# Patient Record
Sex: Female | Born: 1975 | Race: White | Hispanic: No | Marital: Married | Smoking: Former smoker
Health system: Southern US, Community
[De-identification: ages and names within clinical notes are randomized; demographics above are authoritative.]

## PROBLEM LIST (undated history)

## (undated) DIAGNOSIS — R519 Headache, unspecified: Secondary | ICD-10-CM

## (undated) DIAGNOSIS — F410 Panic disorder [episodic paroxysmal anxiety] without agoraphobia: Secondary | ICD-10-CM

## (undated) DIAGNOSIS — E669 Obesity, unspecified: Secondary | ICD-10-CM

## (undated) DIAGNOSIS — J039 Acute tonsillitis, unspecified: Secondary | ICD-10-CM

## (undated) DIAGNOSIS — M545 Low back pain, unspecified: Secondary | ICD-10-CM

## (undated) DIAGNOSIS — R1084 Generalized abdominal pain: Secondary | ICD-10-CM

## (undated) DIAGNOSIS — F411 Generalized anxiety disorder: Secondary | ICD-10-CM

## (undated) DIAGNOSIS — M62838 Other muscle spasm: Secondary | ICD-10-CM

## (undated) DIAGNOSIS — F419 Anxiety disorder, unspecified: Secondary | ICD-10-CM

## (undated) DIAGNOSIS — Z789 Other specified health status: Secondary | ICD-10-CM

## (undated) DIAGNOSIS — K2100 Gastro-esophageal reflux disease with esophagitis, without bleeding: Secondary | ICD-10-CM

## (undated) HISTORY — DX: Obesity, unspecified: E66.9

## (undated) HISTORY — DX: Gastro-esophageal reflux disease with esophagitis, without bleeding: K21.00

## (undated) HISTORY — PX: TONSILLECTOMY: SUR1361

## (undated) HISTORY — DX: Generalized anxiety disorder: F41.1

## (undated) HISTORY — DX: Panic disorder (episodic paroxysmal anxiety): F41.0

## (undated) HISTORY — DX: Acute tonsillitis, unspecified: J03.90

## (undated) HISTORY — PX: CHOLECYSTECTOMY: SHX55

## (undated) HISTORY — DX: Low back pain, unspecified: M54.50

## (undated) HISTORY — DX: Generalized abdominal pain: R10.84

## (undated) HISTORY — DX: Anxiety disorder, unspecified: F41.9

## (undated) HISTORY — PX: OTHER SURGICAL HISTORY: SHX169

## (undated) HISTORY — DX: Other muscle spasm: M62.838

## (undated) HISTORY — DX: Headache, unspecified: R51.9

## (undated) HISTORY — PX: TUBAL LIGATION: SHX77

## (undated) HISTORY — PX: ABDOMINAL HYSTERECTOMY: SHX81

---
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
## 2000-12-11 ENCOUNTER — Emergency Department (HOSPITAL_COMMUNITY): Admission: EM | Admit: 2000-12-11 | Discharge: 2000-12-11 | Payer: Self-pay | Admitting: Internal Medicine

## 2000-12-11 ENCOUNTER — Encounter: Payer: Self-pay | Admitting: Emergency Medicine

## 2001-09-27 ENCOUNTER — Ambulatory Visit (HOSPITAL_COMMUNITY): Admission: RE | Admit: 2001-09-27 | Discharge: 2001-09-27 | Payer: Self-pay | Admitting: Obstetrics & Gynecology

## 2001-09-27 ENCOUNTER — Encounter: Payer: Self-pay | Admitting: Obstetrics & Gynecology

## 2003-04-19 ENCOUNTER — Inpatient Hospital Stay (HOSPITAL_COMMUNITY): Admission: AD | Admit: 2003-04-19 | Discharge: 2003-04-21 | Payer: Self-pay | Admitting: Obstetrics and Gynecology

## 2005-07-23 ENCOUNTER — Ambulatory Visit (HOSPITAL_COMMUNITY): Admission: RE | Admit: 2005-07-23 | Discharge: 2005-07-23 | Payer: Self-pay | Admitting: Pulmonary Disease

## 2006-04-13 ENCOUNTER — Inpatient Hospital Stay (HOSPITAL_COMMUNITY): Admission: AD | Admit: 2006-04-13 | Discharge: 2006-04-16 | Payer: Self-pay | Admitting: Obstetrics and Gynecology

## 2006-05-14 ENCOUNTER — Ambulatory Visit (HOSPITAL_COMMUNITY): Admission: RE | Admit: 2006-05-14 | Discharge: 2006-05-14 | Payer: Self-pay | Admitting: Obstetrics and Gynecology

## 2008-07-12 ENCOUNTER — Ambulatory Visit (HOSPITAL_COMMUNITY): Admission: RE | Admit: 2008-07-12 | Discharge: 2008-07-12 | Payer: Self-pay | Admitting: Pulmonary Disease

## 2010-11-01 NOTE — Op Note (Signed)
   NAME:  Bethany Wilson, Bethany Wilson                        ACCOUNT NO.:  [id number]   MEDICAL RECORD NO.:  [id number]                   PATIENT TYPE:  INP   LOCATION:  LDR2                                 FACILITY:  [facility]   PHYSICIAN:  Tilda Burrow, M.D.              DATE OF BIRTH:  [birth year]   DATE OF PROCEDURE:  DATE OF DISCHARGE:                                 OPERATIVE REPORT   PROCEDURE:  Assistance with postpartum hemorrhage.   SURGEON:  Tilda Burrow, M.D.   DETAILS OF PROCEDURE:  The patient was delivered as per Zerita Boers,  C.N.M. notes dictated elsewhere.  I was called to assist for continued  postpartum bleeding.  Thorough internal exam shows he cervix to be without  lacerations.  The patient has a continuous steady ooze.  The cervix was  grasped with 2 ring forceps cross clamping across the tips of the cervix in  order to try the tamponade at the introitus.  We had continued oozing from  the small opening between the ring forceps coming from internal. A smooth,  sharp, banjo curettage using a large curette was then performed obtaining no  placental remnants.  We then continued and used a temporary tamponade which  did not completely improved the condition.  We, therefore, then placed a  vaginal packing up to the internal os and then the vaginal vault with 2-inch  vaginal taping which is left in place for right now. Pressure did not result  in any bleeding past the packing.  This is similar to what was required with  the last pregnancy.  We will keep this in place and observe her for hourly  vital signs moving forward.  Epidural catheter removed by Zerita Boers,  C.N.M.      ___________________________________________                                            Tilda Burrow, M.D.   JVF/MEDQ  D:  04/19/2003  T:  04/19/2003  Job:  [id number]

## 2010-11-01 NOTE — H&P (Signed)
NAME:  Bethany Wilson, Bethany Wilson              ACCOUNT NO.:  [id number]   MEDICAL RECORD NO.:  [id number]          PATIENT TYPE:  AMB   LOCATION:  DAY                           FACILITY:  [facility]   PHYSICIAN:  Tilda Burrow, M.D. DATE OF BIRTH:  [birth year]   DATE OF ADMISSION:  DATE OF DISCHARGE:  LH                              HISTORY & PHYSICAL   ADMISSION DIAGNOSIS:  Desire for elective permanent sterilization.   HISTORY OF PRESENT ILLNESS:  This 35 year old female gravida 3, para 3,  now one month status post vaginal delivery of her third infant, is  admitted for an elective permanent sterilization.  Bethany Wilson remains  abstinent since she delivered four weeks ago.  She desires a permanent  procedure.  She understands the failure rate of this is less than 1%  with Kremes, a fallopian ring application.  Instructions are reviewed  with the patient.   PAST MEDICAL HISTORY:  Seasonal allergies.   PAST SURGICAL HISTORY:  1. Cholecystectomy.  2. Tonsillectomy.  3. Diagnostic laparoscopy, Dr. Langley Gauss.   SPECIAL NOTE:  Dr. Lisette Grinder made note in his comments to the patient that  there was lots of scar tissue around the navel from prior surgery.  Palpation of the abdomen delineates a possible tiny hernia, so a peri-  umbilical incision will be altered to address this concern.   ALLERGIES:  CODEINE AND MOTRIN cause rashes.  The patient is able to  take Vicodin without problems.   SOCIAL HISTORY:  Married.  Works for [employer].   PRIOR OB HISTORY:  A vaginal delivery x3.   PHYSICAL EXAMINATION:  VITAL SIGNS:  Height 5 feet 6 inches, weight 284  pounds, blood pressure 120/78, pulse in t he 70's.  GENERAL:  A large-framed overweight Caucasian female.  HEENT:  Pupils equal, round, reactive.  Extraocular movements intact.  NECK:  Supple.  CHEST:  Clear to auscultation.  HEART:  A regular rate and rhythm.  No appreciable murmurs, rubs or  gallops.  ABDOMEN:  Well-healed umbilical  and suprapubic port sites.  The  umbilicus is very flat and the patient gives a history of prior  challenging entry at laparoscopy.  We will therefore go in as if there  is scar tissue and/or possible hernia.  GENITOURINARY:  External genitalia:  Multiparous, good vaginal length,  good pelvic support.  The uterus is anterior, nontender.  Adnexa without  masses.   IMPRESSION:  Elective sterilization, umbilical hernia.   PLAN:  Laparoscopic tubal sterilization, fallopian rings, umbilical  hernia repair.      Tilda Burrow, M.D.  Electronically Signed     JVF/MEDQ  D:  05/12/2006  T:  05/12/2006  Job:  [id number]   cc:   [facility]

## 2010-11-01 NOTE — Op Note (Signed)
NAME:  Bethany Wilson, Bethany Wilson                        ACCOUNT NO.:  [id number]   MEDICAL RECORD NO.:  [id number]                   PATIENT TYPE:  INP   LOCATION:  LDR2                                 FACILITY:  [facility]   PHYSICIAN:  Tilda Burrow, M.D.              DATE OF BIRTH:  [birth year]   DATE OF PROCEDURE:  DATE OF DISCHARGE:                                 OPERATIVE REPORT   PROCEDURE:  Epidural catheter placement (x2).   SURGEON:  Tilda Burrow, M.D.   DETAILS OF PROCEDURE:  The patient was placed in the sitting position just  before noon after a fluid bolus and standard preparation for epidural.  Loss  of resistance technique was used at the L3-4 interspace with loss of  resistance achieved about 6 cm beneath the skin.  The catheter was then  inserted approximately 3 cm in and taped to her back.  Infusion of a 5 cc  test dose in 1.5% Xylocaine with epinephrine was performed and then followed  with 12 cc per hour.  Everything seemed normal except there was a little  more superficial that we identified a loss of resistance sensation.  We,  therefore, were somewhat questionable as to the positioning of the epidural  catheter.   The patient initially felt like there was some early sensation developing,  but this turned out to be a false perception.  She did have no real relief  from the epidural.  This was tested over an hour.   I returned to labor and delivery at approximately 1:15 p.m. at which time  loss of resistance technique was again used at L2-3 interspace identifying  the epidural space at 8 cm in depth beneath the skin, instilling 5 cc of  1.5% Xylocaine with epinephrine, inserting the epidural catheter 4 cm into  the epidural space and then taping it to the back in standard fashion with  infusion of 5 cc bolus of 0.125% Marcaine with 2 mcg/mL of Fentanyl.  This  was followed with 12  cc per hour and the patient had good analgesic relief  obtained.  The patient had  received some Nubain, 10 mg, and Phenergan 12.5  mg just before the epidural placement.  The 2 analgesic agents combined to  give her hypotension with her blood pressure of 81/45 with no change in  fetal status.  The patient was given a second fluid bolus, IV ephedrine  times 10 mg followed by 10 mg IM with patient allowed then to continue  observation and labor.      ___________________________________________                                            Tilda Burrow, M.D.   JVF/MEDQ  D:  04/19/2003  T:  04/19/2003  Job:  [id number]  

## 2010-11-01 NOTE — Op Note (Signed)
NAME:  Bethany Wilson, Bethany Wilson              ACCOUNT NO.:  [id number]   MEDICAL RECORD NO.:  [id number]          PATIENT TYPE:  AMB   LOCATION:  DAY                           FACILITY:  [facility]   PHYSICIAN:  Tilda Burrow, M.D. DATE OF BIRTH:  1975-09-04   DATE OF PROCEDURE:  DATE OF DISCHARGE:                               OPERATIVE REPORT   PREOPERATIVE DIAGNOSIS:  Elective sterilization, umbilical hernia.   POSTOPERATIVE DIAGNOSIS:  Elective sterilization.   PROCEDURE:  Laparoscopic tubal sterilization, Falope ring.   SURGEON:  Dr. Christin Bach.   ASSISTANT:  None.   ANESTHESIA:  General with endotracheal intubation, _Idacavage__, CRNA.   COMPLICATIONS:  None.   FINDINGS:  Normal tubes and ovaries.  No evidence of adhesions to  abdomen, umbilicus, or pelvis.   INDICATIONS:  A 35 year old female with a prior laparoscopic  cholecystectomy procedures.  Therefore, a modified open technique was  used to insure that we were not addressing an umbilical hernia, as the  patient's umbilicus was pretty flat.   DESCRIPTION OF PROCEDURE:  The abdomen was prepped and draped in the  usual fashion.  The bladder in-and-out catheterized, and Hulka tenaculum  was attached to the cervix for uterine manipulation.  A semi-circular  infraumbilical incision was made from 3 o'clock to 9 o'clock around the  umbilicus, and the subcutaneous fat bluntly dissected down to the  fascia.  Digital inspection of the fascia revealed there was no hernia  present.  We then were able to carefully place a Veress needle through  the fascia and peritoneal surface with good water droplet test.  The  pneumoperitoneum to 3 liters CO2 allowed inspection of the abdomen.  There was no evidence of trauma to the internal organs, and there were  absolutely no adhesions in the area of the umbilicus.  The pelvis was  similarly normal in appearance.  Suprapubic trocar was introduced under  direct visualization.  An 8 mm trocar  used.  Attention was directed to  the uterus, tubes, and ovaries, which were grossly normal.  The  fallopian tubes were elevated, grasped, and a mid segment knuckle of  tube incarcerated on each side with a Falope ring applier.  The  patient then had 200 cc of saline instilled into the abdomen.  The  abdomen deflated.  Laparoscopic trocars removed and subcuticular 4-0  Dexon closure and trocar sites.  Sponge and needle counts were correct.  Patient went to the recovery room in stable condition.      Tilda Burrow, M.D.  Electronically Signed     JVF/MEDQ  D:  05/14/2006  T:  05/14/2006  Job:  [id number]

## 2010-11-01 NOTE — H&P (Signed)
NAME:  Bethany Wilson, Bethany Wilson              ACCOUNT NO.:  [id number]   MEDICAL RECORD NO.:  [id number]          PATIENT TYPE:  INP   LOCATION:  LDR1                          FACILITY:  [facility]   PHYSICIAN:  Tilda Burrow, M.D. DATE OF BIRTH:  May 04, 1976   DATE OF ADMISSION:  04/13/2006  DATE OF DISCHARGE:  LH                                HISTORY & PHYSICAL   REASON FOR ADMISSION:  Pregnancy at 38 weeks and 3 days for elective  induction of labor due to maternal discomfort and insomnia at patient's  request.   PAST MEDICAL HISTORY:  Positive for seasonal allergies.   PAST SURGICAL HISTORY:  Positive for gallbladder tests, consults and  diagnostic lab.   ALLERGIES:  She is allergic to codeine.   FAMILY HISTORY:  Negative.   OBSTETRICAL HISTORY:  She had postpartum hemorrhage with her last pregnancy  which was severe in intensity and had to be attended by Dr. Emelda Fear.  She  also had postpartum hemorrhage with 1st baby in [place], [place].   PRENATAL COURSE:  Uneventful.   LABORATORY DATA:  Blood type is B positive.  UDS negative.  Rubella is  immune.  Hep B surface antigen is negative.  HIV is negative.  HSV is  negative.  Scholl test nonreactive on both draws.  PAP normal. GCS  and  chlamydia are negative.  Repeat is negative.  AFP normal.  GBS is negative,  13.3, 28 weeks hematocrit 40.8. One hour glucose 175.   PHYSICAL EXAMINATION TODAY:  VITAL SIGNS:  Weight 296, blood pressure  126/90.  There is 1+ leuks in the urine, there is positive fetal movement  per patient.  Fundal height 38 cm.  Fetal heart rate is 150, __________ and  regular.  Cervix is 2-3 cm, 70-80% effaced, minus 1 station.   PLAN:  We are going to admit for intravenous pitocin, induction and labor.      Zerita Boers, Lanier Clam      Tilda Burrow, M.D.  Electronically Signed    DL/MEDQ  D:  56/38/7564  T:  04/13/2006  Job:  [id number]   cc:   [facility]   Francoise Schaumann. Milford Cage DO, FAAP  Fax:  [phone]

## 2010-11-01 NOTE — H&P (Signed)
   NAME:  Bethany Wilson, Bethany Wilson NO.:  [id number]   MEDICAL RECORD NO.:  [id number]                  PATIENT TYPE:   LOCATION:                                       FACILITY:   PHYSICIAN:  Tilda Burrow, M.D.              DATE OF BIRTH:   DATE OF ADMISSION:  04/19/2003  DATE OF DISCHARGE:                                HISTORY & PHYSICAL   ADMISSION DIAGNOSIS:  Pregnancy [redacted] weeks gestation, elective induction.  History of rapid labor.   HISTORY OF PRESENT ILLNESS:  This 35 year old female, gravida 2, para 1, AB  0, LMP July 18, 2002 placing menstrual EDC of April 25, 2003 with  corresponding first and second trimester ultrasounds.  She was admitted  after a pregnancy course followed through our office and notable for  appropriate weight gain and fundal height growth.  The patient has gained  from 25 pounds from 271 to 295, has 38 cm fundal height and estimated fetal  weight of 8-to-8-1/2 pounds.  Cervix 2 cm.  Pregnancy was conceived on  Clomid, dates are quite certain. The patient requests elective induction.  She is fully aware that all the usual risks of labor can occur with induced  deliveries as well as spontaneous labor.  She was induced with the first  child at 40 weeks 2 days at 4 cm dilated prior to induction and delivered in  4 hours.   PAST MEDICAL HISTORY:  Conceived on Clomid for anovulation.   PAST SURGICAL HISTORY:  Cholecystectomy, laparoscopic; D&C; tonsillectomy.   ALLERGIES:  CODEINE and MOTRIN.   MEDICATIONS:  Prenatal vitamins.   SOCIAL HISTORY:  Married.   PRENATAL LABS:  Include blood type B positive, EDS negative, rubella  immunity present.  Hepatitis, HIV, GC, Chlamydia, RPR and group B Strep all  negative.  MSAFP negative at 1:3400.  Glucose tolerance test 74 mg%.   The patient plans to bottle feed, plans to have circumcision and may  consider epidural if labor goes slowly.  She wishes to try IV meds first.   PHYSICAL EXAMINATION:  VITAL SIGNS:  Height 5 feet 6 inches.  Weight 295.  Blood pressure 114/80.  GENERAL:  General exam shows a healthy Caucasian female, alert and oriented  x3.  ABDOMEN: Term size fetus.  Vertex presentation, estimated fetal weight 8-to-  8-1/2 pounds.  Cervix 2 cm, 50%, minus 2, anterior.   PLAN:  Admit November 3 in a.m. for amniotomy and Pitocin induction of  labor.    ___________________________________________                                         Tilda Burrow, M.D.   JVF/MEDQ  D:  04/17/2003  T:  04/17/2003  Job:  [id number]

## 2010-11-01 NOTE — Group Therapy Note (Signed)
NAME:  CORDELL, COKE NO.:  [id number]   MEDICAL RECORD NO.:  [id number]          PATIENT TYPE:  INP   LOCATION:  LDR1                          FACILITY:  [facility]   PHYSICIAN:  Tilda Burrow, M.D. DATE OF BIRTH:  August 19, 1975   DATE OF PROCEDURE:  DATE OF DISCHARGE:                                   PROGRESS NOTE   DELIVERY NOTE:  Bethany Wilson complained of a little bit of rectal pressure, so  we checked her and sure enough she was fully dilated at about +2 station.  After literally one push, she had a spontaneous vaginal delivery of a viable  female infant and 12:11.  The mouth and nose were suctioned on the perineum  and the body delivered without any difficulty.  Pitocin 20 unites, diluted  in 1000 mL of lactated Ringer's was infused rapidly IV.  The placenta  separated spontaneously and delivered via controlled cord traction and  maternal pushing effort at 12:15.  It was carefully inspected and appears to  be intact with a three-vessel cord.   Due to a history of two severe postpartum hemorrhages requiring D&C,  although the patient states the official reason was uterine atony, she  received 250 mcg of IM Hemabate and 0.2 mg at IM Methergine.  Despite this  and vigorous uterine massaged, the uterus did become apneic and she started  bleeding.  A total of three more amps of 250 mcg Hemabate were given IM and  bimanual compression was performed.  She still remains apneic.  About 12-15  minutes into this during which she was receiving her medications and uterine  massage was being performed where she would go between being firm and  apneic, bleeding and not bleeding.  She was then given 250 mg of Cytotec PR.  Dr. Emelda Fear had already been called to attend the recovery room.  Five  minutes after she received her Cytotec PR, her bleeding cut off like a  faucet and her uterus became very firm.  At this point, she was not  suffering any GI side effects of all the  medications.   Estimated blood loss is 1500-2000 mL.   We will go ahead and give her an extra bag of Pitocin and some lactated  Ringer's.  Her pulse is below 100 and output is good and bleeding at this  point is under control.  The vagina was inspected and a first-degree  laceration was noted.  It was repaired was 2-0 Vicryl.  The epidural  catheter was then removed and the blue tip visualized as being intact.      Bethany Wilson, C.N.M.      Tilda Burrow, M.D.  Electronically Signed    FC/MEDQ  D:  04/14/2006  T:  04/14/2006  Job:  [id number]   cc:   [facility]   Francoise Schaumann. Milford Cage DO, FAAP  Fax: [phone]

## 2010-11-01 NOTE — Op Note (Signed)
   NAME:  Bethany Wilson, Bethany Wilson                        ACCOUNT NO.:  [id number]   MEDICAL RECORD NO.:  [id number]                   PATIENT TYPE:  INP   LOCATION:  LDR2                                 FACILITY:  [facility]   PHYSICIAN:  Tilda Burrow, M.D.              DATE OF BIRTH:  1976/03/22   DATE OF PROCEDURE:  04/19/2003  DATE OF DISCHARGE:                                  PROCEDURE NOTE   DELIVERY SUMMARY:  Onset of labor is April 19, 2003, at 9 a.m.  Date of delivery April 19, 2003, at 1503 p.m.  Length of first stage of labor 5 hours 50 minutes.  Length of second stage of labor 13 minutes.  Length of third stage of labor 17 minutes.   DELIVERY NOTE:  [name] had a normal spontaneous vaginal delivery of a  viable female infant under epidural anesthesia.  Upon delivery of head a  nuchal cord was noted, which was loosened, and the infant easily slipped  through without any difficulty.  A second degree perineal laceration was  noted.  Infant was dried, suctioned, cord clamped and cut.  Infant at  delivery had strong cry, good movement of all extremities, pinked up well,  and was placed on the mother's abdomen in good condition.  Third stage of  labor was actively managed with 20 units Pitocin in 1000 mL of D5 LR at a  rapid rate.  At this point in time when the placenta upon delivery of  placenta, Ms. [name] made it aware to me that with her last delivery at [facility] she had a postpartum hemorrhage in which she had to have uterine  packing for at least 24 hours.  At delivery of placenta a half-ounce of  Hemabate was given for prophylaxis but the patient had postpartum hemorrhage  due to uterine atony.  After approximately [id number] mL of blood loss, Dr.  Emelda Fear was notified and came to assess the patient at that time.  Vital  signs remained stable during the course of the hemorrhage, but he assessed  the patient.  Placenta and membranes were delivered intact and were  inspected  again by Dr. Emelda Fear and found to be intact.  Uterine curettage  was done.  There were no retained fragments noted with curettage, and at  that time Dr. Emelda Fear inspected for cervical laceration, is in attendance  with the patient.  Estimated blood loss is going to be approximately 1200-  1300 mL.     ________________________________________  ___________________________________________  Zerita Boers, N.M.                       Tilda Burrow, M.D.   DL/MEDQ  D:  62/13/0865  T:  04/19/2003  Job:  [id number]

## 2010-11-01 NOTE — Op Note (Signed)
NAME:  TITIANA, SEVERA              ACCOUNT NO.:  [id number]   MEDICAL RECORD NO.:  [id number]          PATIENT TYPE:  INP   LOCATION:  LDR1                          FACILITY:  [facility]   PHYSICIAN:  Lazaro Arms, M.D.   DATE OF BIRTH:  [birth year]   DATE OF PROCEDURE:  DATE OF DISCHARGE:                                 OPERATIVE REPORT   PROCEDURE:  Epidural placement.   INDICATIONS FOR PROCEDURE:  Tonya is actively in labor.  She is 5 cm,  requesting epidural be placed.   PROCEDURE PERFORMED:  She was placed in sitting position.  Betadine prep is  used.  1% Lidocaine was injected in the L3-L4 interspace.  A 17 gauge Touhy  needle was used, loss of resistance technique employed.  Two skin sticks had  to be made but at the epidural space was found without difficulty.  Using a  loss of resistance technique, 10 mL of 0.125% Bupivacaine plain was given as  a test dose without ill effects. The epidural catheter was fed easily into  the epidural space.  Additional 10 mL was given in two doses of the  epidural.  It was taped down 5 cm in the epidural space.  A percutaneous  infusion of 0.125% Bupivacaine with 2 mc per mL of Fentanyl was given 12 mL  an hour.  The patient tolerated the procedure well.  She has reactive fetal  heart rate tracing.  Blood pressure is stable.      Lazaro Arms, M.D.  Electronically Signed     LHE/MEDQ  D:  04/14/2006  T:  04/14/2006  Job:  [id number]

## 2012-10-12 ENCOUNTER — Encounter (HOSPITAL_COMMUNITY): Payer: Self-pay | Admitting: Emergency Medicine

## 2012-10-12 ENCOUNTER — Emergency Department (HOSPITAL_COMMUNITY): Payer: BC Managed Care – PPO

## 2012-10-12 ENCOUNTER — Emergency Department (HOSPITAL_COMMUNITY)
Admission: EM | Admit: 2012-10-12 | Discharge: 2012-10-12 | Disposition: A | Discharge status: Home / Self Care | Payer: BC Managed Care – PPO | Attending: Emergency Medicine | Admitting: Emergency Medicine

## 2012-10-12 DIAGNOSIS — S39012A Strain of muscle, fascia and tendon of lower back, initial encounter: Secondary | ICD-10-CM

## 2012-10-12 DIAGNOSIS — Z3202 Encounter for pregnancy test, result negative: Secondary | ICD-10-CM | POA: Insufficient documentation

## 2012-10-12 DIAGNOSIS — Y939 Activity, unspecified: Secondary | ICD-10-CM | POA: Insufficient documentation

## 2012-10-12 DIAGNOSIS — Z9089 Acquired absence of other organs: Secondary | ICD-10-CM | POA: Insufficient documentation

## 2012-10-12 DIAGNOSIS — Y929 Unspecified place or not applicable: Secondary | ICD-10-CM | POA: Insufficient documentation

## 2012-10-12 DIAGNOSIS — X58XXXA Exposure to other specified factors, initial encounter: Secondary | ICD-10-CM | POA: Insufficient documentation

## 2012-10-12 DIAGNOSIS — Z9851 Tubal ligation status: Secondary | ICD-10-CM | POA: Insufficient documentation

## 2012-10-12 DIAGNOSIS — N83209 Unspecified ovarian cyst, unspecified side: Secondary | ICD-10-CM | POA: Insufficient documentation

## 2012-10-12 DIAGNOSIS — S335XXA Sprain of ligaments of lumbar spine, initial encounter: Secondary | ICD-10-CM | POA: Insufficient documentation

## 2012-10-12 LAB — URINALYSIS, ROUTINE W REFLEX MICROSCOPIC
Bilirubin Urine: NEGATIVE
Glucose, UA: NEGATIVE mg/dL
Hgb urine dipstick: NEGATIVE
Protein, ur: NEGATIVE mg/dL
Specific Gravity, Urine: <1.005 — ABNORMAL LOW (ref 1.005–1.030)
Urobilinogen, UA: 0.2 mg/dL (ref 0.0–1.0)

## 2012-10-12 LAB — COMPREHENSIVE METABOLIC PANEL
ALT: 18 U/L (ref 0–35)
AST: 16 U/L (ref 0–37)
Albumin: 3.9 g/dL (ref 3.5–5.2)
CO2: 26 mEq/L (ref 19–32)
Chloride: 102 mEq/L (ref 96–112)
GFR calc non Af Amer: >90 mL/min (ref >90)
Sodium: 138 mEq/L (ref 135–145)
Total Bilirubin: 0.6 mg/dL (ref 0.3–1.2)

## 2012-10-12 LAB — CBC WITH DIFFERENTIAL/PLATELET
Basophils Absolute: 0 10*3/uL (ref 0.0–0.1)
Lymphocytes Relative: 31 % (ref 12–46)
Neutro Abs: 5.8 10*3/uL (ref 1.7–7.7)
Neutrophils Relative %: 62 % (ref 43–77)
Platelets: 190 10*3/uL (ref 150–400)
RDW: 13.4 % (ref 11.5–15.5)
WBC: 9.4 10*3/uL (ref 4.0–10.5)

## 2012-10-12 MED ORDER — ONDANSETRON HCL 4 MG/2ML IJ SOLN
4.0000 mg | Freq: Once | INTRAMUSCULAR | Status: AC
  Administered 2012-10-12: 4 mg via INTRAVENOUS
  Filled 2012-10-12: qty 2

## 2012-10-12 MED ORDER — MORPHINE SULFATE 4 MG/ML IJ SOLN
4.0000 mg | Freq: Once | INTRAMUSCULAR | Status: AC
  Administered 2012-10-12: 4 mg via INTRAVENOUS
  Filled 2012-10-12: qty 1

## 2012-10-12 MED ORDER — METHOCARBAMOL 500 MG PO TABS
500.0000 mg | ORAL_TABLET | Freq: Once | ORAL | Status: AC
  Administered 2012-10-12: 500 mg via ORAL
  Filled 2012-10-12: qty 1

## 2012-10-12 MED ORDER — HYDROCODONE-ACETAMINOPHEN 5-325 MG PO TABS: 1.0000 | ORAL_TABLET | ORAL | Status: DC | PRN

## 2012-10-12 MED ORDER — IBUPROFEN 600 MG PO TABS: 600.0000 mg | ORAL_TABLET | Freq: Four times a day (QID) | ORAL | Status: DC | PRN

## 2012-10-12 MED ORDER — SODIUM CHLORIDE 0.9 % IV BOLUS (SEPSIS)
1000.0000 mL | Freq: Once | INTRAVENOUS | Status: AC
  Administered 2012-10-12: 1000 mL via INTRAVENOUS

## 2012-10-12 MED ORDER — KETOROLAC TROMETHAMINE 30 MG/ML IJ SOLN
30.0000 mg | Freq: Once | INTRAMUSCULAR | Status: AC
  Administered 2012-10-12: 30 mg via INTRAVENOUS
  Filled 2012-10-12: qty 1

## 2012-10-12 MED ORDER — METHOCARBAMOL 500 MG PO TABS: 500.0000 mg | ORAL_TABLET | Freq: Two times a day (BID) | ORAL | Status: DC

## 2012-10-12 NOTE — ED Notes (Signed)
Pt c/o left flank pain that radiates to left lower abd since Sunday.

## 2012-10-12 NOTE — ED Notes (Signed)
Pt alert & oriented x4, stable gait. Patient given discharge instructions, paperwork & prescription(s). Patient  instructed to stop at the registration desk to finish any additional paperwork. Patient verbalized understanding. Pt left department w/ no further questions. 

## 2012-10-12 NOTE — ED Provider Notes (Signed)
History  This chart was scribed for Bethany Racer, MD, by Candelaria Stagers, ED Scribe. This patient was seen in room APA08/APA08 and the patient's care was started at 9:35 PM   CSN: [id number]  Arrival date & time 10/12/12  2118   First MD Initiated Contact with Patient 10/12/12 2128      Chief Complaint  Patient presents with  . Flank Pain  . Abdominal Pain     The history is provided by the patient. No language interpreter was used.   Bethany Wilson is a 37 y.o. female who presents to the Emergency Department complaining of intermittent left sided flank pain that started three days ago and became worse today with radiation to lower abdomen.  Pt denies nausea, dysuria, increased urination, or constipation.  When the pain is at worse nothing relieves the pain.  Her LMP was two weeks ago.  Pt reports a current ovarian cyst that is being watched.  Today's pain is different than pain associated with ovarian cyst.    History reviewed. No pertinent past medical history.  Past Surgical History  Procedure Laterality Date  . Cholecystectomy    . Tonsillectomy    . Tubal ligation      History reviewed. No pertinent family history.  History  Substance Use Topics  . Smoking status: Not on file  . Smokeless tobacco: Not on file  . Alcohol Use: No    OB History   Grav Para Term Preterm Abortions TAB SAB Ect Mult Living                  Review of Systems  Gastrointestinal: Positive for abdominal pain.  Genitourinary: Positive for flank pain (left flank pain).  All other systems reviewed and are negative.    Allergies  Other and Shellfish allergy  Home Medications   Current Outpatient Rx  Name  Route  Sig  Dispense  Refill  . acetaminophen (TYLENOL) 500 MG tablet   Oral   Take 1,000 mg by mouth daily as needed for pain.         Marland Kitchen HYDROcodone-acetaminophen (NORCO) 5-325 MG per tablet   Oral   Take 1 tablet by mouth every 4 (four) hours as needed for pain.  10 tablet   0   . ibuprofen (ADVIL,MOTRIN) 600 MG tablet   Oral   Take 1 tablet (600 mg total) by mouth every 6 (six) hours as needed for pain.   30 tablet   0   . methocarbamol (ROBAXIN) 500 MG tablet   Oral   Take 1 tablet (500 mg total) by mouth 2 (two) times daily.   20 tablet   0     BP 129/81  Pulse 80  Temp(Src) 97.8 F (36.6 C) (Oral)  Resp 20  Ht 5\' 8"  (1.727 m)  Wt 235 lb (106.595 kg)  BMI 35.74 kg/m2  SpO2 100%  LMP 09/28/2012  Physical Exam  Nursing note and vitals reviewed. Constitutional: She is oriented to person, place, and time. She appears well-developed and well-nourished.  HENT:  Head: Normocephalic and atraumatic.  Mouth/Throat: Oropharynx is clear and moist.  Eyes: Conjunctivae and EOM are normal. Pupils are equal, round, and reactive to light.  Neck: Normal range of motion.  Cardiovascular: Normal rate, regular rhythm and normal heart sounds.   Pulmonary/Chest: Effort normal and breath sounds normal.  Abdominal: Soft. Bowel sounds are normal. There is no tenderness.  Mild left flank tenderness.   Musculoskeletal: Normal range of motion.  Neurological: She is alert and oriented to person, place, and time.  Skin: Skin is warm and dry.  Psychiatric: She has a normal mood and affect.    ED Course  Procedures   DIAGNOSTIC STUDIES: Oxygen Saturation is 100% on room air, normal by my interpretation.    COORDINATION OF CARE:  9:39 PM Discussed course of care with pt which includes abdomen CT and pain medication. Pt understands and agrees.   11:00 PM Discharged with muscular strain.  Discussed negative CT.  Strict return precautions given.   Labs Reviewed  URINALYSIS, ROUTINE W REFLEX MICROSCOPIC - Abnormal; Notable for the following:    Specific Gravity, Urine <1.005 (*)    All other components within normal limits  CBC WITH DIFFERENTIAL  COMPREHENSIVE METABOLIC PANEL  PREGNANCY, URINE   Ct Abdomen Pelvis Wo Contrast  10/12/2012   *RADIOLOGY REPORT*  Clinical Data: Pain.  Abdominal pain.  Left flank pain radiating to the left lower quadrant.  History of ovarian cyst.  Prior cholecystectomy.  Tubal ligation.  CT ABDOMEN AND PELVIS WITHOUT CONTRAST  Technique:  Multidetector CT imaging of the abdomen and pelvis was performed following the standard protocol without intravenous contrast.  Comparison: None.  Findings: Lung Bases: Dependent atelectasis.  Liver:  Unenhanced CT was performed per clinician order.  Lack of IV contrast limits sensitivity and specificity, especially for evaluation of abdominal/pelvic solid viscera.  Grossly normal.  Spleen:  Normal.  Gallbladder:  Cholecystectomy.  Common bile duct:  Normal.  Pancreas:  Normal.  Adrenal glands:  Tiny left adrenal nodule measuring 8 mm compatible with adenoma.  Kidneys:  No renal calculi.  No hydronephrosis.  Left ureter appears normal.  Right ureter also appears normal.  Stomach:  Grossly normal.  Small bowel:  Normal.  No mesenteric adenopathy.  Colon:   Normal gas filled appendix.  Prominent right-sided stool burden.  No inflammatory changes or obstruction.  Distal colon decompressed.  Prominent stool is present in the rectum.  Pelvic Genitourinary:  Urinary bladder normal.  Tubal ligation clips are present.  Benign appearing cystic lesion in the region of the right ovary compatible with an ovarian cystic lesion measuring 35 mm long axis. No follow-up recommended.  This recommendation follows ACR consensus guidelines:  White Paper of the ACR Incidental Findings Committee II on Adnexal Findings.  J Am Coll Radiol [phone].  Bones:  No aggressive osseous lesions.  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  Vasculature: Normal.  IMPRESSION:  No acute abnormality.  Cholecystectomy.  Tubal ligation.  No renal calculi.   Original Report Authenticated By: Andreas Newport, M.D.      1. Lumbar strain, initial encounter       MDM  I personally  performed the services described in this documentation, which was scribed in my presence. The recorded information has been reviewed and is accurate.        Bethany Racer, MD 10/12/12 [phone]

## 2013-04-22 ENCOUNTER — Ambulatory Visit: Payer: BC Managed Care – PPO | Admitting: Gastroenterology

## 2014-05-24 ENCOUNTER — Other Ambulatory Visit (HOSPITAL_COMMUNITY): Payer: Self-pay | Admitting: Pulmonary Disease

## 2014-05-24 DIAGNOSIS — M5441 Lumbago with sciatica, right side: Secondary | ICD-10-CM

## 2014-05-24 DIAGNOSIS — M5442 Lumbago with sciatica, left side: Principal | ICD-10-CM

## 2014-06-01 ENCOUNTER — Ambulatory Visit (HOSPITAL_COMMUNITY)
Admission: RE | Admit: 2014-06-01 | Discharge: 2014-06-01 | Disposition: A | Discharge status: Home / Self Care | Payer: BC Managed Care – PPO | Source: Ambulatory Visit | Attending: Pulmonary Disease | Admitting: Pulmonary Disease

## 2014-06-01 DIAGNOSIS — M4306 Spondylolysis, lumbar region: Secondary | ICD-10-CM | POA: Insufficient documentation

## 2014-06-01 DIAGNOSIS — M5441 Lumbago with sciatica, right side: Secondary | ICD-10-CM

## 2014-06-01 DIAGNOSIS — M545 Low back pain: Secondary | ICD-10-CM | POA: Insufficient documentation

## 2014-06-01 DIAGNOSIS — M5442 Lumbago with sciatica, left side: Secondary | ICD-10-CM

## 2014-09-22 ENCOUNTER — Other Ambulatory Visit: Payer: Self-pay | Admitting: Obstetrics and Gynecology

## 2014-09-22 DIAGNOSIS — N63 Unspecified lump in unspecified breast: Secondary | ICD-10-CM

## 2014-09-29 ENCOUNTER — Encounter (INDEPENDENT_AMBULATORY_CARE_PROVIDER_SITE_OTHER): Payer: Self-pay

## 2014-09-29 ENCOUNTER — Ambulatory Visit
Admission: RE | Admit: 2014-09-29 | Discharge: 2014-09-29 | Disposition: A | Discharge status: Home / Self Care | Payer: BLUE CROSS/BLUE SHIELD | Source: Ambulatory Visit | Attending: Obstetrics and Gynecology | Admitting: Obstetrics and Gynecology

## 2014-09-29 DIAGNOSIS — N63 Unspecified lump in unspecified breast: Secondary | ICD-10-CM

## 2015-03-08 ENCOUNTER — Other Ambulatory Visit: Payer: Self-pay | Admitting: Obstetrics and Gynecology

## 2015-03-08 DIAGNOSIS — N63 Unspecified lump in unspecified breast: Secondary | ICD-10-CM

## 2015-04-02 ENCOUNTER — Ambulatory Visit
Admission: RE | Admit: 2015-04-02 | Discharge: 2015-04-02 | Disposition: A | Discharge status: Home / Self Care | Payer: BLUE CROSS/BLUE SHIELD | Source: Ambulatory Visit | Attending: Obstetrics and Gynecology | Admitting: Obstetrics and Gynecology

## 2015-04-02 DIAGNOSIS — N63 Unspecified lump in unspecified breast: Secondary | ICD-10-CM

## 2015-12-06 ENCOUNTER — Other Ambulatory Visit: Payer: Self-pay | Admitting: Obstetrics and Gynecology

## 2015-12-06 DIAGNOSIS — N63 Unspecified lump in unspecified breast: Secondary | ICD-10-CM

## 2015-12-24 ENCOUNTER — Ambulatory Visit
Admission: RE | Admit: 2015-12-24 | Discharge: 2015-12-24 | Disposition: A | Discharge status: Home / Self Care | Payer: BLUE CROSS/BLUE SHIELD | Source: Ambulatory Visit | Attending: Obstetrics and Gynecology | Admitting: Obstetrics and Gynecology

## 2015-12-24 DIAGNOSIS — N63 Unspecified lump in unspecified breast: Secondary | ICD-10-CM

## 2016-04-10 LAB — OB RESULTS CONSOLE RUBELLA ANTIBODY, IGM
Rubella: IMMUNE
Rubella: IMMUNE
Rubella: IMMUNE

## 2016-04-10 LAB — OB RESULTS CONSOLE HEPATITIS B SURFACE ANTIGEN
HEP B S AG: NEGATIVE
Hepatitis B Surface Ag: NEGATIVE
Hepatitis B Surface Ag: NEGATIVE

## 2016-04-10 LAB — OB RESULTS CONSOLE ABO/RH: RH Type: POSITIVE

## 2016-04-10 LAB — OB RESULTS CONSOLE HIV ANTIBODY (ROUTINE TESTING)
HIV: NONREACTIVE
HIV: NONREACTIVE
HIV: NONREACTIVE

## 2016-04-10 LAB — OB RESULTS CONSOLE GC/CHLAMYDIA
CHLAMYDIA, DNA PROBE: NEGATIVE
CHLAMYDIA, DNA PROBE: NEGATIVE
CHLAMYDIA, DNA PROBE: NEGATIVE
GC PROBE AMP, GENITAL: NEGATIVE
Gonorrhea: NEGATIVE
Gonorrhea: NEGATIVE

## 2016-04-10 LAB — OB RESULTS CONSOLE ANTIBODY SCREEN: Antibody Screen: NEGATIVE

## 2016-04-10 LAB — OB RESULTS CONSOLE RPR
RPR: NONREACTIVE
RPR: NONREACTIVE

## 2016-04-24 DIAGNOSIS — N978 Female infertility of other origin: Secondary | ICD-10-CM | POA: Diagnosis not present

## 2016-04-24 DIAGNOSIS — Z3181 Encounter for male factor infertility in female patient: Secondary | ICD-10-CM | POA: Diagnosis not present

## 2016-04-24 DIAGNOSIS — N971 Female infertility of tubal origin: Secondary | ICD-10-CM | POA: Diagnosis not present

## 2016-05-06 DIAGNOSIS — N978 Female infertility of other origin: Secondary | ICD-10-CM | POA: Diagnosis not present

## 2016-05-16 DIAGNOSIS — N978 Female infertility of other origin: Secondary | ICD-10-CM | POA: Diagnosis not present

## 2016-05-16 DIAGNOSIS — Z3181 Encounter for male factor infertility in female patient: Secondary | ICD-10-CM | POA: Diagnosis not present

## 2016-05-17 DIAGNOSIS — N978 Female infertility of other origin: Secondary | ICD-10-CM | POA: Diagnosis not present

## 2016-05-17 DIAGNOSIS — Z3181 Encounter for male factor infertility in female patient: Secondary | ICD-10-CM | POA: Diagnosis not present

## 2016-05-18 DIAGNOSIS — Z3181 Encounter for male factor infertility in female patient: Secondary | ICD-10-CM | POA: Diagnosis not present

## 2016-05-18 DIAGNOSIS — N978 Female infertility of other origin: Secondary | ICD-10-CM | POA: Diagnosis not present

## 2016-06-03 DIAGNOSIS — Z3200 Encounter for pregnancy test, result unknown: Secondary | ICD-10-CM | POA: Diagnosis not present

## 2016-06-05 DIAGNOSIS — Z3201 Encounter for pregnancy test, result positive: Secondary | ICD-10-CM | POA: Diagnosis not present

## 2016-06-05 DIAGNOSIS — Z3A01 Less than 8 weeks gestation of pregnancy: Secondary | ICD-10-CM | POA: Diagnosis not present

## 2016-06-16 NOTE — L&D Delivery Note (Signed)
Delivery Note  SVD viable female Apgars 8,9 over 1st deg lac.  Placenta delivered spontaneously intact with 3VC. Repair with 3-0 Chromic with good support and hemostasis noted and R/V exam confirms.  PH art was sent.  [place] cord blood was not done..  Mother and baby were doing well.  EBL 400cc Methergine given due to her hx of PPH with prev pregnancy,  MEU clear  Candice Camp, MD

## 2016-06-19 DIAGNOSIS — O200 Threatened abortion: Secondary | ICD-10-CM | POA: Diagnosis not present

## 2016-06-19 DIAGNOSIS — Z3A01 Less than 8 weeks gestation of pregnancy: Secondary | ICD-10-CM | POA: Diagnosis not present

## 2016-06-24 DIAGNOSIS — O30001 Twin pregnancy, unspecified number of placenta and unspecified number of amniotic sacs, first trimester: Secondary | ICD-10-CM | POA: Diagnosis not present

## 2016-06-24 DIAGNOSIS — O0901 Supervision of pregnancy with history of infertility, first trimester: Secondary | ICD-10-CM | POA: Diagnosis not present

## 2016-06-24 DIAGNOSIS — O200 Threatened abortion: Secondary | ICD-10-CM | POA: Diagnosis not present

## 2016-06-24 DIAGNOSIS — O09521 Supervision of elderly multigravida, first trimester: Secondary | ICD-10-CM | POA: Diagnosis not present

## 2016-07-04 DIAGNOSIS — Z3181 Encounter for male factor infertility in female patient: Secondary | ICD-10-CM | POA: Diagnosis not present

## 2016-07-04 DIAGNOSIS — N978 Female infertility of other origin: Secondary | ICD-10-CM | POA: Diagnosis not present

## 2016-07-18 DIAGNOSIS — Z3685 Encounter for antenatal screening for Streptococcus B: Secondary | ICD-10-CM | POA: Diagnosis not present

## 2016-07-22 DIAGNOSIS — Z3491 Encounter for supervision of normal pregnancy, unspecified, first trimester: Secondary | ICD-10-CM | POA: Diagnosis not present

## 2016-08-04 DIAGNOSIS — Z36 Encounter for antenatal screening for chromosomal anomalies: Secondary | ICD-10-CM | POA: Diagnosis not present

## 2016-08-04 DIAGNOSIS — Z3682 Encounter for antenatal screening for nuchal translucency: Secondary | ICD-10-CM | POA: Diagnosis not present

## 2016-08-04 DIAGNOSIS — Z3A12 12 weeks gestation of pregnancy: Secondary | ICD-10-CM | POA: Diagnosis not present

## 2016-08-04 DIAGNOSIS — Z3491 Encounter for supervision of normal pregnancy, unspecified, first trimester: Secondary | ICD-10-CM | POA: Diagnosis not present

## 2016-09-11 DIAGNOSIS — Z363 Encounter for antenatal screening for malformations: Secondary | ICD-10-CM | POA: Diagnosis not present

## 2016-10-02 DIAGNOSIS — Z3483 Encounter for supervision of other normal pregnancy, third trimester: Secondary | ICD-10-CM | POA: Diagnosis not present

## 2016-10-02 DIAGNOSIS — Z3482 Encounter for supervision of other normal pregnancy, second trimester: Secondary | ICD-10-CM | POA: Diagnosis not present

## 2016-10-08 DIAGNOSIS — Z362 Encounter for other antenatal screening follow-up: Secondary | ICD-10-CM | POA: Diagnosis not present

## 2016-11-05 DIAGNOSIS — Z3480 Encounter for supervision of other normal pregnancy, unspecified trimester: Secondary | ICD-10-CM | POA: Diagnosis not present

## 2016-11-05 DIAGNOSIS — Z362 Encounter for other antenatal screening follow-up: Secondary | ICD-10-CM | POA: Diagnosis not present

## 2016-11-05 DIAGNOSIS — Z3A26 26 weeks gestation of pregnancy: Secondary | ICD-10-CM | POA: Diagnosis not present

## 2016-12-23 DIAGNOSIS — O3663X0 Maternal care for excessive fetal growth, third trimester, not applicable or unspecified: Secondary | ICD-10-CM | POA: Diagnosis not present

## 2016-12-23 DIAGNOSIS — Z3A33 33 weeks gestation of pregnancy: Secondary | ICD-10-CM | POA: Diagnosis not present

## 2017-01-14 DIAGNOSIS — Z3685 Encounter for antenatal screening for Streptococcus B: Secondary | ICD-10-CM | POA: Diagnosis not present

## 2017-01-20 DIAGNOSIS — Z3688 Encounter for antenatal screening for fetal macrosomia: Secondary | ICD-10-CM | POA: Diagnosis not present

## 2017-01-20 DIAGNOSIS — Z3A37 37 weeks gestation of pregnancy: Secondary | ICD-10-CM | POA: Diagnosis not present

## 2017-01-21 ENCOUNTER — Encounter (HOSPITAL_COMMUNITY): Payer: Self-pay | Admitting: *Deleted

## 2017-01-21 ENCOUNTER — Telehealth (HOSPITAL_COMMUNITY): Payer: Self-pay | Admitting: *Deleted

## 2017-01-21 LAB — OB RESULTS CONSOLE GBS: STREP GROUP B AG: POSITIVE

## 2017-01-21 NOTE — Telephone Encounter (Signed)
Preadmission screen  

## 2017-02-04 ENCOUNTER — Inpatient Hospital Stay (HOSPITAL_COMMUNITY): Payer: BLUE CROSS/BLUE SHIELD | Admitting: Anesthesiology

## 2017-02-04 ENCOUNTER — Inpatient Hospital Stay (HOSPITAL_COMMUNITY)
Admission: RE | Admit: 2017-02-04 | Discharge: 2017-02-06 | DRG: 775 | Disposition: A | Discharge status: Home / Self Care | Payer: BLUE CROSS/BLUE SHIELD | Source: Ambulatory Visit | Attending: Obstetrics and Gynecology | Admitting: Obstetrics and Gynecology

## 2017-02-04 ENCOUNTER — Encounter (HOSPITAL_COMMUNITY): Payer: Self-pay

## 2017-02-04 DIAGNOSIS — Z87891 Personal history of nicotine dependence: Secondary | ICD-10-CM | POA: Diagnosis not present

## 2017-02-04 DIAGNOSIS — Z6841 Body Mass Index (BMI) 40.0 and over, adult: Secondary | ICD-10-CM

## 2017-02-04 DIAGNOSIS — O99214 Obesity complicating childbirth: Secondary | ICD-10-CM | POA: Diagnosis not present

## 2017-02-04 DIAGNOSIS — O99824 Streptococcus B carrier state complicating childbirth: Principal | ICD-10-CM | POA: Diagnosis present

## 2017-02-04 DIAGNOSIS — Z3493 Encounter for supervision of normal pregnancy, unspecified, third trimester: Secondary | ICD-10-CM | POA: Diagnosis not present

## 2017-02-04 DIAGNOSIS — Z8759 Personal history of other complications of pregnancy, childbirth and the puerperium: Secondary | ICD-10-CM

## 2017-02-04 DIAGNOSIS — Z3A39 39 weeks gestation of pregnancy: Secondary | ICD-10-CM | POA: Diagnosis not present

## 2017-02-04 HISTORY — DX: Other specified health status: Z78.9

## 2017-02-04 LAB — CBC
HEMATOCRIT: 37.2 % (ref 36.0–46.0)
HEMOGLOBIN: 12.2 g/dL (ref 12.0–15.0)
MCH: 27.5 pg (ref 26.0–34.0)
MCHC: 32.8 g/dL (ref 30.0–36.0)
MCV: 83.8 fL (ref 78.0–100.0)
Platelets: 237 10*3/uL (ref 150–400)
RBC: 4.44 MIL/uL (ref 3.87–5.11)
RDW: 14 % (ref 11.5–15.5)
WBC: 11.8 10*3/uL — ABNORMAL HIGH (ref 4.0–10.5)

## 2017-02-04 LAB — RPR: RPR Ser Ql: NONREACTIVE

## 2017-02-04 LAB — TYPE AND SCREEN
ABO/RH(D): B POS
ANTIBODY SCREEN: NEGATIVE

## 2017-02-04 LAB — ABO/RH: ABO/RH(D): B POS

## 2017-02-04 MED ORDER — MISOPROSTOL 200 MCG PO TABS
800.0000 ug | ORAL_TABLET | Freq: Once | ORAL | Status: AC
  Administered 2017-02-04: 800 ug via RECTAL

## 2017-02-04 MED ORDER — TERBUTALINE SULFATE 1 MG/ML IJ SOLN
0.2500 mg | Freq: Once | INTRAMUSCULAR | Status: DC | PRN
  Filled 2017-02-04: qty 1

## 2017-02-04 MED ORDER — WITCH HAZEL-GLYCERIN EX PADS: 1.0000 "application " | MEDICATED_PAD | CUTANEOUS | Status: DC | PRN

## 2017-02-04 MED ORDER — PHENYLEPHRINE 40 MCG/ML (10ML) SYRINGE FOR IV PUSH (FOR BLOOD PRESSURE SUPPORT)
80.0000 ug | PREFILLED_SYRINGE | INTRAVENOUS | Status: DC | PRN
  Filled 2017-02-04: qty 5
  Filled 2017-02-04: qty 10

## 2017-02-04 MED ORDER — MEDROXYPROGESTERONE ACETATE 150 MG/ML IM SUSP: 150.0000 mg | INTRAMUSCULAR | Status: DC | PRN

## 2017-02-04 MED ORDER — COCONUT OIL OIL: 1.0000 "application " | TOPICAL_OIL | Status: DC | PRN

## 2017-02-04 MED ORDER — ACETAMINOPHEN 325 MG PO TABS: 650.0000 mg | ORAL_TABLET | ORAL | Status: DC | PRN

## 2017-02-04 MED ORDER — SIMETHICONE 80 MG PO CHEW: 80.0000 mg | CHEWABLE_TABLET | ORAL | Status: DC | PRN

## 2017-02-04 MED ORDER — PRENATAL MULTIVITAMIN CH
1.0000 | ORAL_TABLET | Freq: Every day | ORAL | Status: DC
  Administered 2017-02-05 – 2017-02-06 (×2): 1 via ORAL
  Filled 2017-02-04 (×2): qty 1

## 2017-02-04 MED ORDER — EPHEDRINE 5 MG/ML INJ
10.0000 mg | INTRAVENOUS | Status: DC | PRN
Start: 2017-02-04 — End: 2017-02-04
  Filled 2017-02-04: qty 2

## 2017-02-04 MED ORDER — MEASLES, MUMPS & RUBELLA VAC ~~LOC~~ INJ: 0.5000 mL | INJECTION | Freq: Once | SUBCUTANEOUS | Status: DC

## 2017-02-04 MED ORDER — METHYLERGONOVINE MALEATE 0.2 MG PO TABS
0.2000 mg | ORAL_TABLET | Freq: Four times a day (QID) | ORAL | Status: AC
  Administered 2017-02-04 – 2017-02-05 (×5): 0.2 mg via ORAL
  Filled 2017-02-04 (×6): qty 1

## 2017-02-04 MED ORDER — LACTATED RINGERS IV SOLN
INTRAVENOUS | Status: DC
  Administered 2017-02-04 (×3): via INTRAVENOUS

## 2017-02-04 MED ORDER — DIPHENHYDRAMINE HCL 25 MG PO CAPS: 25.0000 mg | ORAL_CAPSULE | Freq: Four times a day (QID) | ORAL | Status: DC | PRN

## 2017-02-04 MED ORDER — LIDOCAINE HCL (PF) 1 % IJ SOLN
INTRAMUSCULAR | Status: DC | PRN
  Administered 2017-02-04 (×2): 4 mL via EPIDURAL

## 2017-02-04 MED ORDER — BENZOCAINE-MENTHOL 20-0.5 % EX AERO
1.0000 "application " | INHALATION_SPRAY | CUTANEOUS | Status: DC | PRN
  Filled 2017-02-04: qty 56

## 2017-02-04 MED ORDER — SENNOSIDES-DOCUSATE SODIUM 8.6-50 MG PO TABS
2.0000 | ORAL_TABLET | ORAL | Status: DC
  Administered 2017-02-05 (×2): 2 via ORAL
  Filled 2017-02-04 (×2): qty 2

## 2017-02-04 MED ORDER — SODIUM CHLORIDE 0.9 % IV SOLN
2.0000 g | Freq: Once | INTRAVENOUS | Status: AC
  Administered 2017-02-04: 2 g via INTRAVENOUS
  Filled 2017-02-04: qty 2000

## 2017-02-04 MED ORDER — SODIUM CHLORIDE 0.9 % IV SOLN
2.0000 g | Freq: Four times a day (QID) | INTRAVENOUS | Status: DC
  Administered 2017-02-04: 2 g via INTRAVENOUS
  Filled 2017-02-04 (×3): qty 2000

## 2017-02-04 MED ORDER — DIBUCAINE 1 % RE OINT: 1.0000 "application " | TOPICAL_OINTMENT | RECTAL | Status: DC | PRN

## 2017-02-04 MED ORDER — ONDANSETRON HCL 4 MG/2ML IJ SOLN
4.0000 mg | Freq: Four times a day (QID) | INTRAMUSCULAR | Status: DC | PRN
  Administered 2017-02-04: 4 mg via INTRAVENOUS
  Filled 2017-02-04: qty 2

## 2017-02-04 MED ORDER — OXYCODONE-ACETAMINOPHEN 5-325 MG PO TABS: 2.0000 | ORAL_TABLET | ORAL | Status: DC | PRN

## 2017-02-04 MED ORDER — ONDANSETRON HCL 4 MG PO TABS: 4.0000 mg | ORAL_TABLET | ORAL | Status: DC | PRN

## 2017-02-04 MED ORDER — SOD CITRATE-CITRIC ACID 500-334 MG/5ML PO SOLN: 30.0000 mL | ORAL | Status: DC | PRN

## 2017-02-04 MED ORDER — DIPHENHYDRAMINE HCL 50 MG/ML IJ SOLN: 12.5000 mg | INTRAMUSCULAR | Status: DC | PRN

## 2017-02-04 MED ORDER — PHENYLEPHRINE 40 MCG/ML (10ML) SYRINGE FOR IV PUSH (FOR BLOOD PRESSURE SUPPORT)
80.0000 ug | PREFILLED_SYRINGE | INTRAVENOUS | Status: DC | PRN
  Administered 2017-02-04 (×2): 80 ug via INTRAVENOUS
  Filled 2017-02-04: qty 5

## 2017-02-04 MED ORDER — METHYLERGONOVINE MALEATE 0.2 MG/ML IJ SOLN: 0.2000 mg | Freq: Four times a day (QID) | INTRAMUSCULAR | Status: AC

## 2017-02-04 MED ORDER — MISOPROSTOL 200 MCG PO TABS
ORAL_TABLET | ORAL | Status: AC
  Filled 2017-02-04: qty 4

## 2017-02-04 MED ORDER — ACETAMINOPHEN 325 MG PO TABS
650.0000 mg | ORAL_TABLET | ORAL | Status: DC | PRN
  Administered 2017-02-05 (×2): 650 mg via ORAL
  Filled 2017-02-04 (×2): qty 2

## 2017-02-04 MED ORDER — TETANUS-DIPHTH-ACELL PERTUSSIS 5-2.5-18.5 LF-MCG/0.5 IM SUSP: 0.5000 mL | Freq: Once | INTRAMUSCULAR | Status: DC

## 2017-02-04 MED ORDER — ZOLPIDEM TARTRATE 5 MG PO TABS: 5.0000 mg | ORAL_TABLET | Freq: Every evening | ORAL | Status: DC | PRN

## 2017-02-04 MED ORDER — BUTORPHANOL TARTRATE 1 MG/ML IJ SOLN: 1.0000 mg | INTRAMUSCULAR | Status: DC | PRN

## 2017-02-04 MED ORDER — OXYTOCIN 40 UNITS IN LACTATED RINGERS INFUSION - SIMPLE MED: 2.5000 [IU]/h | INTRAVENOUS | Status: DC

## 2017-02-04 MED ORDER — IBUPROFEN 600 MG PO TABS
600.0000 mg | ORAL_TABLET | Freq: Four times a day (QID) | ORAL | Status: DC
  Administered 2017-02-05 – 2017-02-06 (×7): 600 mg via ORAL
  Filled 2017-02-04 (×8): qty 1

## 2017-02-04 MED ORDER — FENTANYL 2.5 MCG/ML BUPIVACAINE 1/10 % EPIDURAL INFUSION (WH - ANES)
14.0000 mL/h | INTRAMUSCULAR | Status: DC | PRN
  Administered 2017-02-04: 14 mL/h via EPIDURAL
  Filled 2017-02-04: qty 100

## 2017-02-04 MED ORDER — OXYCODONE-ACETAMINOPHEN 5-325 MG PO TABS: 1.0000 | ORAL_TABLET | ORAL | Status: DC | PRN

## 2017-02-04 MED ORDER — OXYTOCIN BOLUS FROM INFUSION
500.0000 mL | Freq: Once | INTRAVENOUS | Status: AC
  Administered 2017-02-04: 500 mL via INTRAVENOUS

## 2017-02-04 MED ORDER — OXYTOCIN 40 UNITS IN LACTATED RINGERS INFUSION - SIMPLE MED
1.0000 m[IU]/min | INTRAVENOUS | Status: DC
  Administered 2017-02-04: 2 m[IU]/min via INTRAVENOUS
  Filled 2017-02-04: qty 1000

## 2017-02-04 MED ORDER — ONDANSETRON HCL 4 MG/2ML IJ SOLN: 4.0000 mg | INTRAMUSCULAR | Status: DC | PRN

## 2017-02-04 MED ORDER — LACTATED RINGERS IV SOLN
500.0000 mL | INTRAVENOUS | Status: DC | PRN
  Administered 2017-02-04: 500 mL via INTRAVENOUS

## 2017-02-04 MED ORDER — LACTATED RINGERS IV SOLN
500.0000 mL | Freq: Once | INTRAVENOUS | Status: AC
  Administered 2017-02-04: 500 mL via INTRAVENOUS

## 2017-02-04 MED ORDER — EPHEDRINE 5 MG/ML INJ
10.0000 mg | INTRAVENOUS | Status: DC | PRN
  Filled 2017-02-04: qty 2

## 2017-02-04 MED ORDER — METHYLERGONOVINE MALEATE 0.2 MG/ML IJ SOLN
INTRAMUSCULAR | Status: AC
  Administered 2017-02-04: 0.2 mg
  Filled 2017-02-04: qty 1

## 2017-02-04 MED ORDER — LIDOCAINE HCL (PF) 1 % IJ SOLN
30.0000 mL | INTRAMUSCULAR | Status: DC | PRN
  Filled 2017-02-04: qty 30

## 2017-02-04 NOTE — Anesthesia Pain Management Evaluation Note (Signed)
  CRNA Pain Management Visit Note  Patient: Bethany Wilson, 41 y.o., female  "Hello I am a member of the anesthesia team at [facility]. We have an anesthesia team available at all times to provide care throughout the hospital, including epidural management and anesthesia for C-section. I don't know your plan for the delivery whether it a natural birth, water birth, IV sedation, nitrous supplementation, doula or epidural, but we want to meet your pain goals."   1.Was your pain managed to your expectations on prior hospitalizations?   No prior hospitalizations  2.What is your expectation for pain management during this hospitalization?     Epidural  3.How can we help you reach that goal? epidural  Record the patient's initial score and the patient's pain goal.   Pain: 2  Pain Goal: 4 [facility] wants you to be able to say your pain was always managed very well.  Madison Hickman 02/04/2017

## 2017-02-04 NOTE — Progress Notes (Signed)
Phenylephrine administered

## 2017-02-04 NOTE — Anesthesia Preprocedure Evaluation (Signed)
Anesthesia Evaluation  Patient identified by MRN, date of birth, ID band Patient awake    Reviewed: Allergy & Precautions, Patient's Chart, lab work & pertinent test results  Airway Mallampati: III  TM Distance: >3 FB Neck ROM: Full    Dental no notable dental hx. (+) Teeth Intact   Pulmonary former smoker,    Pulmonary exam normal breath sounds clear to auscultation       Cardiovascular negative cardio ROS Normal cardiovascular exam Rhythm:Regular Rate:Normal     Neuro/Psych negative neurological ROS  negative psych ROS   GI/Hepatic Neg liver ROS, GERD  ,  Endo/Other  Morbid obesity  Renal/GU negative Renal ROS  negative genitourinary   Musculoskeletal negative musculoskeletal ROS (+)   Abdominal (+) + obese,   Peds  Hematology   Anesthesia Other Findings   Reproductive/Obstetrics (+) Pregnancy IVF                             Anesthesia Physical Anesthesia Plan  ASA: III  Anesthesia Plan: Epidural   Post-op Pain Management:    Induction:   PONV Risk Score and Plan:   Airway Management Planned: Natural Airway  Additional Equipment:   Intra-op Plan:   Post-operative Plan:   Informed Consent: I have reviewed the patients History and Physical, chart, labs and discussed the procedure including the risks, benefits and alternatives for the proposed anesthesia with the patient or authorized representative who has indicated his/her understanding and acceptance.     Plan Discussed with: Anesthesiologist  Anesthesia Plan Comments:         Anesthesia Quick Evaluation

## 2017-02-04 NOTE — Anesthesia Postprocedure Evaluation (Signed)
Anesthesia Post Note  Patient: Bethany Wilson  Procedure(s) Performed: * No procedures listed *     Patient location during evaluation: Mother Baby Anesthesia Type: Epidural Level of consciousness: awake and alert and oriented Pain management: pain level controlled Vital Signs Assessment: post-procedure vital signs reviewed and stable Respiratory status: spontaneous breathing and nonlabored ventilation Cardiovascular status: stable Postop Assessment: no headache, patient able to bend at knees, no backache, no signs of nausea or vomiting, epidural receding and adequate PO intake Anesthetic complications: no    Last Vitals:  Vitals:   02/04/17 1602 02/04/17 1704  BP: 126/63 133/74  Pulse: 73 70  Resp: 18 18  Temp:  36.8 C  SpO2:  100%    Last Pain:  Vitals:   02/04/17 1516  TempSrc:   PainSc: 0-No pain   Pain Goal:                 [place] [employer]

## 2017-02-04 NOTE — Anesthesia Procedure Notes (Signed)
Epidural Patient location during procedure: OB Start time: 02/04/2017 9:17 AM  Staffing Anesthesiologist: Mal Amabile Performed: anesthesiologist   Preanesthetic Checklist Completed: patient identified, site marked, surgical consent, pre-op evaluation, timeout performed, IV checked, risks and benefits discussed and monitors and equipment checked  Epidural Patient position: sitting Prep: site prepped and draped and DuraPrep Patient monitoring: continuous pulse ox and blood pressure Approach: midline Location: L3-L4 Injection technique: LOR air  Needle:  Needle type: Tuohy  Needle gauge: 17 G Needle length: 9 cm and 9 Needle insertion depth: 8 cm Catheter type: closed end flexible Catheter size: 19 Gauge Catheter at skin depth: 13 cm Test dose: negative and Other  Assessment Events: blood not aspirated, injection not painful, no injection resistance, negative IV test and no paresthesia  Additional Notes Patient identified. Risks and benefits discussed including failed block, incomplete  Pain control, post dural puncture headache, nerve damage, paralysis, blood pressure Changes, nausea, vomiting, reactions to medications-both toxic and allergic and post Partum back pain. All questions were answered. Patient expressed understanding and wished to proceed. Sterile technique was used throughout procedure. Epidural site was Dressed with sterile barrier dressing. No paresthesias, signs of intravascular injection Or signs of intrathecal spread were encountered.  Patient was more comfortable after the epidural was dosed. Please see RN's note for documentation of vital signs and FHR which are stable.

## 2017-02-04 NOTE — Lactation Note (Signed)
This note was copied from a baby's chart. Lactation Consultation Note  Patient Name: Bethany Wilson YOVZC'H Date: 02/04/2017 Reason for consult: Initial assessment Baby at 2 hr of life. Mom stated she did not try bf her 1st child, she had low supply with her 2nd child, and she sis not try bf with her 3rd child. She desires to ebf this baby. She requested a DEBP so she can pump between feeding for extra stimulation. After gathering the supplies and returning to the room, there were 6 visitors present. RN will set up DEBP when mom calls out. Mom stated this baby has latched well. She denies breast or nipple pain. Discussed baby behavior, feeding frequency, baby belly size, voids, wt loss, breast changes, and nipple care. She stated she can manually express and has a spoon in the room. Given lactation handouts. Aware of OP services and support group.     Maternal Data Has patient been taught Hand Expression?: Yes Does the patient have breastfeeding experience prior to this delivery?: Yes  Feeding Feeding Type: Breast Fed Length of feed: 30 min  LATCH Score Latch: Grasps breast easily, tongue down, lips flanged, rhythmical sucking.  Audible Swallowing: Spontaneous and intermittent  Type of Nipple: Everted at rest and after stimulation  Comfort (Breast/Nipple): Soft / non-tender  Hold (Positioning): No assistance needed to correctly position infant at breast.  LATCH Score: 10  Interventions    Lactation Tools Discussed/Used WIC Program: No   Consult Status Consult Status: Follow-up Date: 02/05/17 Follow-up type: In-patient    Rulon Eisenmenger 02/04/2017, 5:41 PM

## 2017-02-04 NOTE — H&P (Signed)
NALAH HAAN is a 41 y.o. female presenting for IOL due to adv cx dilation, hx of rapid labor and GBS+.  Pregnancy IVF with AMA and normal NIPT. OB History    Gravida Para Term Preterm AB Living   4 3 3     3    SAB TAB Ectopic Multiple Live Births           3     Past Medical History:  Diagnosis Date  . Medical history non-contributory   . Newborn product of in vitro fertilization (IVF) pregnancy    Past Surgical History:  Procedure Laterality Date  . CHOLECYSTECTOMY    . TONSILLECTOMY    . TUBAL LIGATION     Family History: family history includes Breast cancer in her mother; Diabetes in her mother; Heart disease in her paternal grandmother; Hypertension in her father and mother. Social History:  reports that she has quit smoking. She has never used smokeless tobacco. She reports that she does not drink alcohol or use drugs.     Maternal Diabetes: No Genetic Screening: Normal Maternal Ultrasounds/Referrals: Normal Fetal Ultrasounds or other Referrals:  None Maternal Substance Abuse:  No Significant Maternal Medications:  None Significant Maternal Lab Results:  None Other Comments:  None  ROS History Dilation: 7 Effacement (%): 90 Station: -1 Exam by:: A Showfety RN Blood pressure 131/83, pulse 80, temperature 98.4 F (36.9 C), temperature source Oral, resp. rate 18, height 5\' 7"  (1.702 m), weight (!) 303 lb (137.4 kg). Exam Physical Exam  Prenatal labs: ABO, Rh: B/Positive/-- (10/26 0000) Antibody: Negative (10/26 0000) Rubella: Immune, Immune (10/26 0000) RPR: Nonreactive, Nonreactive (10/26 0000)  HBsAg: Negative, Negative (10/26 0000)  HIV: Non-reactive, Non-reactive (10/26 0000)  GBS: Positive (08/08 0000)   Assessment/Plan: IUP at term Adv Cx dilation Abx for GBS already in.  Pt wants epidural now   Charon Smedberg C 02/04/2017, 8:55 AM

## 2017-02-04 NOTE — Progress Notes (Signed)
Phenylephrine admin

## 2017-02-05 LAB — CBC
HCT: 33.1 % — ABNORMAL LOW (ref 36.0–46.0)
Hemoglobin: 10.9 g/dL — ABNORMAL LOW (ref 12.0–15.0)
MCH: 27.7 pg (ref 26.0–34.0)
MCHC: 32.9 g/dL (ref 30.0–36.0)
MCV: 84.2 fL (ref 78.0–100.0)
PLATELETS: 180 10*3/uL (ref 150–400)
RBC: 3.93 MIL/uL (ref 3.87–5.11)
RDW: 13.9 % (ref 11.5–15.5)
WBC: 12.1 10*3/uL — AB (ref 4.0–10.5)

## 2017-02-05 NOTE — Progress Notes (Signed)
Post Partum Day 1 Subjective: no complaints, up ad lib, voiding, tolerating PO and + flatus  Objective: Blood pressure 127/74, pulse 68, temperature 98.3 F (36.8 C), temperature source Oral, resp. rate 18, height 5\' 7"  (1.702 m), weight (!) 303 lb (137.4 kg), SpO2 100 %, unknown if currently breastfeeding.  Physical Exam:  General: alert, cooperative and no distress Lochia: appropriate Uterine Fundus: firm Incision: healing well DVT Evaluation: No evidence of DVT seen on physical exam.   Recent Labs  02/04/17 0806 02/05/17 0514  HGB 12.2 10.9*  HCT 37.2 33.1*    Assessment/Plan: Plan for discharge tomorrow   LOS: 1 day   Bethany Wilson,Bethany Wilson 02/05/2017, 8:01 AM

## 2017-02-05 NOTE — Lactation Note (Signed)
This note was copied from a baby's chart. Lactation Consultation Note  Patient Name: Bethany Wilson ZRAQT'M Date: 02/05/2017 Reason for consult: Follow-up assessment Baby at 24 hr of life. Mom stated baby is not latching well and she is not making enough milk. She offers the breast every cue, if baby "does not do much", baby is offered a bottle of formula while mom uses DEBP. Mom stated she is getting drops with the pump and she can not get anything with manual expression. Offered to help with latch or pumping and she declined at this time. Discussed baby behavior, feeding frequency, pumping frequency, supplementing volumes, baby belly size, voids, wt loss, breast changes, and nipple care. Mom is aware of lactation services and support group.     Maternal Data    Feeding Feeding Type: Bottle Fed - Formula Nipple Type: Slow - flow  LATCH Score                   Interventions    Lactation Tools Discussed/Used     Consult Status Consult Status: Follow-up Date: 02/06/17 Follow-up type: In-patient    Rulon Eisenmenger 02/05/2017, 3:28 PM

## 2017-02-06 MED ORDER — IBUPROFEN 600 MG PO TABS: 600.0000 mg | ORAL_TABLET | Freq: Four times a day (QID) | ORAL | 0 refills | Status: DC

## 2017-02-06 NOTE — Progress Notes (Signed)
Post Partum Day 2 Subjective: no complaints, up ad lib, voiding and tolerating PO  Objective: Blood pressure 135/80, pulse 66, temperature 97.6 F (36.4 C), temperature source Oral, resp. rate 16, height 5\' 7"  (1.702 m), weight (!) 303 lb (137.4 kg), SpO2 100 %, unknown if currently breastfeeding.  Physical Exam:  General: alert, cooperative and appears stated age Lochia: appropriate Uterine Fundus: firm Incision: healing well, no significant drainage DVT Evaluation: No evidence of DVT seen on physical exam. Negative Homan's sign. No cords or calf tenderness.   Recent Labs  02/04/17 0806 02/05/17 0514  HGB 12.2 10.9*  HCT 37.2 33.1*    Assessment/Plan: Discharge home   LOS: 2 days   Gladyce Mcray 02/06/2017, 8:37 AM

## 2017-02-06 NOTE — Discharge Summary (Signed)
Obstetric Discharge Summary Reason for Admission: induction of labor Prenatal Procedures: none Intrapartum Procedures: spontaneous vaginal delivery Postpartum Procedures: none Complications-Operative and Postpartum: 1st  degree perineal laceration Hemoglobin  Date Value Ref Range Status  02/05/2017 10.9 (L) 12.0 - 15.0 g/dL Final   HCT  Date Value Ref Range Status  02/05/2017 33.1 (L) 36.0 - 46.0 % Final    Physical Exam:  General: alert, cooperative and appears stated age 41: appropriate Uterine Fundus: firm Incision: healing well, no significant drainage DVT Evaluation: No evidence of DVT seen on physical exam. Negative Homan's sign. No cords or calf tenderness.  Discharge Diagnoses: Term Pregnancy-delivered  Discharge Information: Date: 02/06/2017 Activity: pelvic rest Diet: routine Medications: PNV and Ibuprofen Condition: stable Instructions: refer to practice specific booklet Discharge to: home   Newborn Data: Live born female  Birth Weight: 9 lb 0.8 oz (4105 g) APGAR: 8, 9  Home with mother.  Bethany Wilson 02/06/2017, 8:41 AM

## 2017-02-06 NOTE — Lactation Note (Signed)
This note was copied from a baby's chart. Lactation Consultation Note  Patient Name: Bethany Wilson OEHOZ'Y Date: 02/06/2017 Reason for consult: Follow-up assessment Baby at 42 hr of life and dyad set for d/c today. Mom is latching baby at every cue, giving baby to dad to offer formula bottles while she post pumps. She is getting drops with the DEBP. Discussed baby behavior, feeding frequency, pumping, supplementing volumes increase with age, baby belly size, voids, wt loss, breast changes, and nipple care. Parents are aware of lactation services and support group. They will call as needed.    Maternal Data    Feeding Feeding Type: Bottle Fed - Formula Nipple Type: Slow - flow  LATCH Score                   Interventions    Lactation Tools Discussed/Used     Consult Status Consult Status: Complete Follow-up type: Call as needed    Rulon Eisenmenger 02/06/2017, 8:48 AM

## 2017-02-06 NOTE — Discharge Instructions (Signed)
Call MD for T>100.4, heavy vaginal bleeding, severe abdominal pain, or respiratory distress.  Call office to schedule postpartum visit in 6 weeks.  Pelvic rest x 6 weeks.   

## 2017-03-16 DIAGNOSIS — Z1389 Encounter for screening for other disorder: Secondary | ICD-10-CM | POA: Diagnosis not present

## 2017-04-03 DIAGNOSIS — N870 Mild cervical dysplasia: Secondary | ICD-10-CM | POA: Diagnosis not present

## 2017-04-03 DIAGNOSIS — R87612 Low grade squamous intraepithelial lesion on cytologic smear of cervix (LGSIL): Secondary | ICD-10-CM | POA: Diagnosis not present

## 2017-04-24 DIAGNOSIS — N870 Mild cervical dysplasia: Secondary | ICD-10-CM | POA: Diagnosis not present

## 2017-04-24 DIAGNOSIS — N888 Other specified noninflammatory disorders of cervix uteri: Secondary | ICD-10-CM | POA: Diagnosis not present

## 2017-04-24 DIAGNOSIS — R87612 Low grade squamous intraepithelial lesion on cytologic smear of cervix (LGSIL): Secondary | ICD-10-CM | POA: Diagnosis not present

## 2017-11-30 DIAGNOSIS — R87612 Low grade squamous intraepithelial lesion on cytologic smear of cervix (LGSIL): Secondary | ICD-10-CM | POA: Diagnosis not present

## 2017-11-30 DIAGNOSIS — Z1231 Encounter for screening mammogram for malignant neoplasm of breast: Secondary | ICD-10-CM | POA: Diagnosis not present

## 2017-11-30 LAB — HM MAMMOGRAPHY

## 2018-03-19 DIAGNOSIS — E669 Obesity, unspecified: Secondary | ICD-10-CM | POA: Diagnosis not present

## 2018-03-19 DIAGNOSIS — M545 Low back pain: Secondary | ICD-10-CM | POA: Diagnosis not present

## 2018-03-19 DIAGNOSIS — K210 Gastro-esophageal reflux disease with esophagitis: Secondary | ICD-10-CM | POA: Diagnosis not present

## 2018-03-19 DIAGNOSIS — F419 Anxiety disorder, unspecified: Secondary | ICD-10-CM | POA: Diagnosis not present

## 2018-04-23 DIAGNOSIS — Z6841 Body Mass Index (BMI) 40.0 and over, adult: Secondary | ICD-10-CM | POA: Diagnosis not present

## 2018-04-23 DIAGNOSIS — R87612 Low grade squamous intraepithelial lesion on cytologic smear of cervix (LGSIL): Secondary | ICD-10-CM | POA: Diagnosis not present

## 2018-04-23 DIAGNOSIS — Z01419 Encounter for gynecological examination (general) (routine) without abnormal findings: Secondary | ICD-10-CM | POA: Diagnosis not present

## 2018-11-18 DIAGNOSIS — M545 Low back pain: Secondary | ICD-10-CM | POA: Diagnosis not present

## 2018-11-18 DIAGNOSIS — E669 Obesity, unspecified: Secondary | ICD-10-CM | POA: Diagnosis not present

## 2018-11-18 DIAGNOSIS — F419 Anxiety disorder, unspecified: Secondary | ICD-10-CM | POA: Diagnosis not present

## 2018-12-08 DIAGNOSIS — R87613 High grade squamous intraepithelial lesion on cytologic smear of cervix (HGSIL): Secondary | ICD-10-CM | POA: Diagnosis not present

## 2018-12-08 LAB — HM PAP SMEAR: HM Pap smear: NEGATIVE

## 2019-05-09 DIAGNOSIS — R519 Headache, unspecified: Secondary | ICD-10-CM | POA: Insufficient documentation

## 2019-05-09 DIAGNOSIS — K2100 Gastro-esophageal reflux disease with esophagitis, without bleeding: Secondary | ICD-10-CM | POA: Insufficient documentation

## 2019-05-09 DIAGNOSIS — F411 Generalized anxiety disorder: Secondary | ICD-10-CM

## 2019-05-09 DIAGNOSIS — M545 Low back pain, unspecified: Secondary | ICD-10-CM | POA: Insufficient documentation

## 2019-05-09 DIAGNOSIS — E669 Obesity, unspecified: Secondary | ICD-10-CM | POA: Insufficient documentation

## 2019-05-26 ENCOUNTER — Encounter: Payer: Self-pay | Admitting: Family Medicine

## 2019-05-26 ENCOUNTER — Other Ambulatory Visit: Payer: Self-pay

## 2019-05-26 ENCOUNTER — Ambulatory Visit: Payer: BC Managed Care – PPO | Admitting: Family Medicine

## 2019-05-26 VITALS — BP 119/75 | HR 76 | Temp 98.2°F | Ht 66.0 in | Wt 236.6 lb

## 2019-05-26 DIAGNOSIS — R600 Localized edema: Secondary | ICD-10-CM

## 2019-05-26 DIAGNOSIS — Z0000 Encounter for general adult medical examination without abnormal findings: Secondary | ICD-10-CM | POA: Diagnosis not present

## 2019-05-26 NOTE — Progress Notes (Signed)
New Patient Office Visit  Subjective:  Patient ID: Bethany Wilson, female    DOB: [birth year]  Age: 43 y.o. MRN: [id number]  CC:  Chief Complaint  Patient presents with  . Establish Care  anxiety-pt states she uses approximately 3 Xanax /week at night to sleep. Pt states she has panic attacks at night that affect her sleep. If she takes the medication to eliminate the panic and states she only takes the medication at night-never during the day.  Pt is not on any daily medication for anxiety and depression. 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 Fill Date ID   Written Drug Qty Days Prescriber Rx # Pharmacy Refill   Daily Dose* Pymt Type PMP    05/16/2019  1   05/16/2019  Alprazolam 1 MG Tablet  120.00  30 Ed Haw   [id number]   Car (9744)   0  8.00 LME  Comm Ins   [place]  04/15/2019  1   01/13/2019  Alprazolam 1 MG Tablet  120.00  30 Ed Haw   [id number]   Car (9744)   3  8.00 LME  Comm Ins   [place]  03/17/2019  1   01/13/2019  Alprazolam 1 MG Tablet  120.00  30 Ed Haw   [id number]   Car (9744)   2  8.00 LME  Comm Ins   [place]  02/15/2019  1   01/13/2019  Alprazolam 1 MG Tablet  120.00  30 Ed Haw   [id number]   Car (9744)   1  8.00 LME  Comm Ins   [place]  01/13/2019  1   01/13/2019  Alprazolam 1 MG Tablet  120.00  30 Ed Haw   [id number]   Car (9744)   0  8.00 LME  Comm Ins   [place]  12/13/2018  1   09/14/2018  Alprazolam 1 MG Tablet  120.00  30 Ed Haw   [id number]   Car (9744)   3  8.00 LME  Comm Ins   [place]  11/16/2018  1   09/14/2018  Alprazolam 1 MG Tablet  120.00  30 Ed Haw   [id number]   Car (9744)   2  8.00 LME  Comm Ins   [place]  10/14/2018  1   09/14/2018  Alprazolam 1 MG Tablet  120.00  30 Ed Haw   [id number]   Car (9744)   1  8.00 LME  Comm Ins   [place]  09/14/2018  1   09/14/2018  Alprazolam 1 MG Tablet  120.00  30 Ed Haw   [id number]   Car (9744)   0  8.00 LME  Comm Ins   [place]  08/16/2018  1   07/19/2018  Alprazolam 1 MG Tablet  120.00  30 Ed Haw   [id number]   Car (9744)   0  8.00 LME   Comm Ins   [place]  07/19/2018  1   04/15/2018  Alprazolam 1 MG Tablet  120.00  30 Ed Haw   [id number]   Car (9744)   3  8.00 LME  Comm Ins   [place]  06/16/2018  1   04/15/2018  Alprazolam 1 MG Tablet  120.00  30 Ed Haw   [id number]   Car (9744)   2  8.00 LME  Comm Ins     05/17/2018  1   04/15/2018  Alprazolam 1 MG Tablet  120.00  30 Ed  Haw   [id number]   Car (9744)   1  8.00 LME  Comm Ins   [place]  04/15/2018  1   04/15/2018  Alprazolam 1 MG Tablet  120.00  30 Ed Haw   [id number]   Car (9744)   0  8.00 LME  Comm Ins   [place]  03/19/2018  1   02/16/2018  Alprazolam 1 MG Tablet  120.00  30 Ed Haw   [id number]   Car (9744)   0  8.00 LME  Comm Ins   [place]  02/15/2018  1   01/19/2018  Alprazolam 1 MG Tablet  120.00  30 Ed Haw   [id number]   Car (9744)   1  8.00 LME  Comm Ins   [place]  01/19/2018  1   01/19/2018  Alprazolam 1 MG Tablet  120.00  30 Ed Haw   [id number]   Car (9744)   0  8.00 LME  Comm Ins   [place]  12/14/2017  1   11/13/2017  Alprazolam 1 MG Tablet  120.00  30 Ed Haw   [id number]   Car (9744)   0  8.00 LME  Comm Ins   [place]  11/13/2017  1   05/25/2017  Alprazolam 1 MG Tablet  120.00  30 Ed Haw   [id number]   Car (9744)   5  8.00 LME  Comm Ins   [place]  10/14/2017  1   05/25/2017  Alprazolam 1 MG Tablet  120.00  30 Ed Haw   [id number]   Car (9744)   4  8.00 LME  Comm Ins   [place]  09/14/2017  1   05/25/2017  Alprazolam 1 MG Tablet  120.00  30 Ed Haw   [id number]   Car (9744)   3  8.00 LME  Comm Ins   [place]  08/17/2017  1   05/25/2017  Alprazolam 1 MG Tablet  120.00  30 Ed Haw   [id number]   Car (9744)   2  8.00 LME  Comm Ins   [place]  07/17/2017  1   05/25/2017  Alprazolam 1 MG Tablet  120.00  30 Ed Haw   [id number]   Car (9744)   1  8.00 LME  Comm Ins   [place]  06/17/2017  1   05/25/2017  Alprazolam 1 MG Tablet  120.00  30 Ed Haw   [id number]   Car (9744)   0  8.00 LME  Comm Ins   [place]   03/19/2018  1   02/16/2018  Alprazolam 1 MG Tablet  120.00  30 Ed Haw   [id number]   Car (9744)   0  8.00 LME  Comm Ins   [place]  02/15/2018  1   01/19/2018  Alprazolam 1 MG Tablet   120.00  30 Ed Haw   [id number]   Car (9744)   1  8.00 LME  Comm Ins   [place]  01/19/2018  1   01/19/2018  Alprazolam 1 MG Tablet  120.00  30 Ed Haw   [id number]   Car (9744)   0  8.00 LME  Comm Ins   [place]  12/14/2017  1   11/13/2017  Alprazolam 1 MG Tablet  120.00  30 Ed Haw   [id number]   Car (9744)   0  8.00 LME  Comm Ins   [place]  11/13/2017  1   05/25/2017  Alprazolam 1 MG Tablet  120.00  30 Birdie SonsEd Haw   [id number]  Car (9744)   5  8.00 LME  Comm Ins   [place]  10/14/2017  1   05/25/2017  Alprazolam 1 MG Tablet  120.00  30 Ed Haw   [id number]   Car (9744)   4  8.00 LME  Comm Ins   [place]  09/14/2017  1   05/25/2017  Alprazolam 1 MG Tablet  120.00  30 Ed Haw   [id number]   Car (9744)   3  8.00 LME  Comm Ins   [place]  08/17/2017  1   05/25/2017  Alprazolam 1 MG Tablet  120.00  30 Ed Haw   [id number]   Car (9744)   2  8.00 LME  Comm Ins   [place]  07/17/2017  1   05/25/2017  Alprazolam 1 MG Tablet  120.00  30 Ed Haw   [id number]   Car (9744)   1  8.00 LME  Comm Ins   [place]  06/17/2017  1   05/25/2017  Alprazolam 1 MG Tablet  120.00  30 Ed Haw   [id number]   Car (9744)   0  8.00 LME  Comm Ins   [place]     HPI Syvanna Ciolino Abshier presents for LE edema-uses Lasix prn when legs retain fluid 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 Pt has no elevated blood pressure and no h/o heart disease. Pt states she uses several times a month.    Contacts-My eye doctor yearly  GYN exam yearly-mammogram/pap smear  Past Medical History:  Diagnosis Date  . 'light-for-dates' infant with signs of fetal malnutrition   . 'light-for-dates' infant with signs of fetal malnutrition   . Abdominal pain, generalized   . Acute tonsillitis   . Anxiety   . Anxiety state   . Gastro-esophageal reflux disease with esophagitis   . Headache   . Low back pain   . Medical history non-contributory   . Newborn product of in vitro fertilization (IVF) pregnancy   . Obesity, unspecified   . Panic disorder without agoraphobia   . Spasm of muscle     Past  Surgical History:  Procedure Laterality Date  . ABDOMINAL HYSTERECTOMY    . CHOLECYSTECTOMY    . TONSILLECTOMY    . TUBAL LIGATION    . unlisted laproscopy procedure spermatic cord      Family History  Problem Relation Age of Onset  . Hypertension Mother   . Breast cancer Mother   . Diabetes Mother   . Cancer Mother   . Hypertension Father   . Heart disease Paternal Grandmother     Social History  Live with husband (22yo-college ) 16yo,13yo, 2yo( 43yo, 21yo)-husband's children Socioeconomic History  . Marital status: Married    Spouse name: Not on file  . Number of children: Not on file  . Years of education: Not on file  . Highest education level: Not on file  Occupational History  . Occupation: hairstylist  Tobacco Use  . Smoking status: Former Games developer  . Smokeless tobacco: Never Used  Substance and Sexual Activity  . Alcohol use: No  . Drug use: No  . Sexual activity: Yes    Birth control/protection: None  Other Topics Concern  . Not on file  Social History Narrative  . Not on file   Social Determinants of Health   Financial Resource Strain:   . Difficulty of Paying Living Expenses: Not on file  Food Insecurity:   . Worried About Programme researcher, broadcasting/film/video in the  Last Year: Not on file  . Ran Out of Food in the Last Year: Not on file  Transportation Needs:   . Lack of Transportation (Medical): Not on file  . Lack of Transportation (Non-Medical): Not on file  Physical Activity:   . Days of Exercise per Week: Not on file  . Minutes of Exercise per Session: Not on file  Stress:   . Feeling of Stress : Not on file  Social Connections:   . Frequency of Communication with Friends and Family: Not on file  . Frequency of Social Gatherings with Friends and Family: Not on file  . Attends Religious Services: Not on file  . Active Member of Clubs or Organizations: Not on file  . Attends Banker Meetings: Not on file  . Marital Status: Not on file  Intimate  Partner Violence:   . Fear of Current or Ex-Partner: Not on file  . Emotionally Abused: Not on file  . Physically Abused: Not on file  . Sexually Abused: Not on file    ROS Review of Systems  Constitutional: Negative.   HENT: Negative.   Eyes:       Contacts  Respiratory: Negative.   Cardiovascular: Negative.   Gastrointestinal: Negative.   Endocrine: Negative.   Genitourinary: Negative.   Musculoskeletal: Negative.   Skin: Negative.   Allergic/Immunologic: Negative.   Neurological: Negative.   Hematological:       [facility]  Psychiatric/Behavioral: The patient is nervous/anxious.        Worse at night while sleeping     Objective:   Today's Vitals: BP 119/75 (BP Location: Left Arm, Patient Position: Sitting, Cuff Size: Normal)   Pulse 76   Temp 98.2 F (36.8 C) (Oral)   Ht 5\' 6"  (1.676 m)   Wt 236 lb 9.6 oz (107.3 kg)   SpO2 98%   BMI 38.19 kg/m   Physical Exam Constitutional:      Appearance: Normal appearance.  HENT:     Head: Normocephalic and atraumatic.  Cardiovascular:     Rate and Rhythm: Normal rate and regular rhythm.     Pulses: Normal pulses.     Heart sounds: Normal heart sounds.  Pulmonary:     Effort: Pulmonary effort is normal.     Breath sounds: Normal breath sounds.  Musculoskeletal:     Cervical back: Normal range of motion and neck supple.     Right lower leg: No edema.     Left lower leg: No edema.  Neurological:     Mental Status: She is alert and oriented to person, place, and time.  Psychiatric:        Mood and Affect: Mood normal.        Behavior: Behavior normal.     Assessment & Plan:   1. Bilateral leg edema No h/o CAD-dependent edema likely related to occupation-need baseline-no recent labwork - CBC with Differential - COMPLETE METABOLIC PANEL WITH GFR  2. Health maintenance examination No labwork completed since birth of child 2+ years ago-no difficulty during pregnancy - CBC with Differential - COMPLETE METABOLIC  PANEL WITH GFR - Lipid panel - TSH Outpatient Encounter Medications as of 05/26/2019  Medication Sig  . ALPRAZolam (XANAX) 1 MG tablet Take 1 mg by mouth 4 (four) times daily as needed for anxiety (as needed for severe anxiety).  . furosemide (LASIX) 20 MG tablet Take 20 mg by mouth daily as needed for fluid or edema.  14/03/2019 ibuprofen (ADVIL,MOTRIN) 600 MG tablet Take  1 tablet (600 mg total) by mouth every 6 (six) hours. (Patient not taking: Reported on 05/26/2019)  . [DISCONTINUED] acetaminophen (TYLENOL) 500 MG tablet Take 1,000 mg by mouth daily as needed for pain.  . [DISCONTINUED] albuterol (VENTOLIN HFA) 108 (90 Base) MCG/ACT inhaler Inhale 2 puffs into the lungs every 6 (six) hours as needed for wheezing or shortness of breath.  . [DISCONTINUED] calcium carbonate (TUMS - DOSED IN MG ELEMENTAL CALCIUM) 500 MG chewable tablet Chew 1 tablet by mouth 2 (two) times daily as needed for indigestion or heartburn.  . [DISCONTINUED] gabapentin (NEURONTIN) 300 MG capsule Take 300 mg by mouth 3 (three) times daily.  . [DISCONTINUED] Prenatal Vit-Fe Fumarate-FA (PRENATAL MULTIVITAMIN) TABS tablet Take 1 tablet by mouth daily at 12 noon.  . [DISCONTINUED] ranitidine (ZANTAC) 150 MG tablet Take 150 mg by mouth 2 (two) times daily as needed for heartburn.   No facility-administered encounter medications on file as of 05/26/2019.   DISCUSSED WITH PT AT LENGTH-WOULD NOT RX XANAX -pt states she only took 3/week at night only-discussed possible sleep study to determine if sleep irregularity related to panic attacks.  D/w pt referral to psy for additional evaluation and treatment. Offered buspar as substitute for anxiety with wean off xanax over the next few weeks-last fill 11/30 with 120/month. If pt only taking 3/week , extended weaning possible with decrease in dose to 1/2 then 1/4. Pt declined switch in medication or psy referral.  Follow-up: labwork-baseline  Ziyanna Tolin Mat Carne, MD

## 2019-05-26 NOTE — Patient Instructions (Addendum)
Insomnia Consider sleep study-if any irregular sleeping pattern Consider psy evaluation for medication management  Fasting labwork   Insomnia is a sleep disorder that makes it difficult to fall asleep or stay asleep. Insomnia can cause fatigue, low energy, difficulty concentrating, mood swings, and poor performance at work or school. There are three different ways to classify insomnia:  Difficulty falling asleep.  Difficulty staying asleep.  Waking up too early in the morning. Any type of insomnia can be long-term (chronic) or short-term (acute). Both are common. Short-term insomnia usually lasts for three months or less. Chronic insomnia occurs at least three times a week for longer than three months. What are the causes? Insomnia may be caused by another condition, situation, or substance, such as:  Anxiety.  Certain medicines.  Gastroesophageal reflux disease (GERD) or other gastrointestinal conditions.  Asthma or other breathing conditions.  Restless legs syndrome, sleep apnea, or other sleep disorders.  Chronic pain.  Menopause.  Stroke.  Abuse of alcohol, tobacco, or illegal drugs.  Mental health conditions, such as depression.  Caffeine.  Neurological disorders, such as Alzheimer's disease.  An overactive thyroid (hyperthyroidism). Sometimes, the cause of insomnia may not be known. What increases the risk? Risk factors for insomnia include:  Gender. Women are affected more often than men.  Age. Insomnia is more common as you get older.  Stress.  Lack of exercise.  Irregular work schedule or working night shifts.  Traveling between different time zones.  Certain medical and mental health conditions. What are the signs or symptoms? If you have insomnia, the main symptom is having trouble falling asleep or having trouble staying asleep. This may lead to other symptoms, such as:  Feeling fatigued or having low energy.  Feeling nervous about going to  sleep.  Not feeling rested in the morning.  Having trouble concentrating.  Feeling irritable, anxious, or depressed. How is this diagnosed? This condition may be diagnosed based on:  Your symptoms and medical history. Your health care provider may ask about: ? Your sleep habits. ? Any medical conditions you have. ? Your mental health.  A physical exam. How is this treated? Treatment for insomnia depends on the cause. Treatment may focus on treating an underlying condition that is causing insomnia. Treatment may also include:  Medicines to help you sleep.  Counseling or therapy.  Lifestyle adjustments to help you sleep better. Follow these instructions at home: Eating and drinking   Limit or avoid alcohol, caffeinated beverages, and cigarettes, especially close to bedtime. These can disrupt your sleep.  Do not eat a large meal or eat spicy foods right before bedtime. This can lead to digestive discomfort that can make it hard for you to sleep. Sleep habits   Keep a sleep diary to help you and your health care provider figure out what could be causing your insomnia. Write down: ? When you sleep. ? When you wake up during the night. ? How well you sleep. ? How rested you feel the next day. ? Any side effects of medicines you are taking. ? What you eat and drink.  Make your bedroom a dark, comfortable place where it is easy to fall asleep. ? Put up shades or blackout curtains to block light from outside. ? Use a white noise machine to block noise. ? Keep the temperature cool.  Limit screen use before bedtime. This includes: ? Watching TV. ? Using your smartphone, tablet, or computer.  Stick to a routine that includes going to bed and  waking up at the same times every day and night. This can help you fall asleep faster. Consider making a quiet activity, such as reading, part of your nighttime routine.  Try to avoid taking naps during the day so that you sleep better at  night.  Get out of bed if you are still awake after 15 minutes of trying to sleep. Keep the lights down, but try reading or doing a quiet activity. When you feel sleepy, go back to bed. General instructions  Take over-the-counter and prescription medicines only as told by your health care provider.  Exercise regularly, as told by your health care provider. Avoid exercise starting several hours before bedtime.  Use relaxation techniques to manage stress. Ask your health care provider to suggest some techniques that may work well for you. These may include: ? Breathing exercises. ? Routines to release muscle tension. ? Visualizing peaceful scenes.  Make sure that you drive carefully. Avoid driving if you feel very sleepy.  Keep all follow-up visits as told by your health care provider. This is important. Contact a health care provider if:  You are tired throughout the day.  You have trouble in your daily routine due to sleepiness.  You continue to have sleep problems, or your sleep problems get worse. Get help right away if:  You have serious thoughts about hurting yourself or someone else. If you ever feel like you may hurt yourself or others, or have thoughts about taking your own life, get help right away. You can go to your nearest emergency department or call:  Your local emergency services (911 in the [place]  A suicide crisis helpline, such as the National Suicide Prevention Lifeline at 506-642-5833. This is open 24 hours a day. Summary  Insomnia is a sleep disorder that makes it difficult to fall asleep or stay asleep.  Insomnia can be long-term (chronic) or short-term (acute).  Treatment for insomnia depends on the cause. Treatment may focus on treating an underlying condition that is causing insomnia.  Keep a sleep diary to help you and your health care provider figure out what could be causing your insomnia. This information is not intended to replace advice given  to you by your health care provider. Make sure you discuss any questions you have with your health care provider. Document Released: 05/30/2000 Document Revised: 05/15/2017 Document Reviewed: 03/12/2017 Elsevier Patient Education  2020 [employer] Generalized Anxiety Disorder, Adult Generalized anxiety disorder (GAD) is a mental health disorder. People with this condition constantly worry about everyday events. Unlike normal anxiety, worry related to GAD is not triggered by a specific event. These worries also do not fade or get better with time. GAD interferes with life functions, including relationships, work, and school. GAD can vary from mild to severe. People with severe GAD can have intense waves of anxiety with physical symptoms (panic attacks). What are the causes? The exact cause of GAD is not known. What increases the risk? This condition is more likely to develop in:  Women.  People who have a family history of anxiety disorders.  People who are very shy.  People who experience very stressful life events, such as the death of a loved one.  People who have a very stressful family environment. What are the signs or symptoms? People with GAD often worry excessively about many things in their lives, such as their health and family. They may also be overly concerned about:  Doing well at work.  Being on time.  Natural disasters.  Friendships. Physical symptoms of GAD include:  Fatigue.  Muscle tension or having muscle twitches.  Trembling or feeling shaky.  Being easily startled.  Feeling like your heart is pounding or racing.  Feeling out of breath or like you cannot take a deep breath.  Having trouble falling asleep or staying asleep.  Sweating.  Nausea, diarrhea, or irritable bowel syndrome (IBS).  Headaches.  Trouble concentrating or remembering facts.  Restlessness.  Irritability. How is this diagnosed? Your health care provider can diagnose GAD  based on your symptoms and medical history. You will also have a physical exam. The health care provider will ask specific questions about your symptoms, including how severe they are, when they started, and if they come and go. Your health care provider may ask you about your use of alcohol or drugs, including prescription medicines. Your health care provider may refer you to a mental health specialist for further evaluation. Your health care provider will do a thorough examination and may perform additional tests to rule out other possible causes of your symptoms. To be diagnosed with GAD, a person must have anxiety that:  Is out of his or her control.  Affects several different aspects of his or her life, such as work and relationships.  Causes distress that makes him or her unable to take part in normal activities.  Includes at least three physical symptoms of GAD, such as restlessness, fatigue, trouble concentrating, irritability, muscle tension, or sleep problems. Before your health care provider can confirm a diagnosis of GAD, these symptoms must be present more days than they are not, and they must last for six months or longer. How is this treated? The following therapies are usually used to treat GAD:  Medicine. Antidepressant medicine is usually prescribed for long-term daily control. Antianxiety medicines may be added in severe cases, especially when panic attacks occur.  Talk therapy (psychotherapy). Certain types of talk therapy can be helpful in treating GAD by providing support, education, and guidance. Options include: ? Cognitive behavioral therapy (CBT). People learn coping skills and techniques to ease their anxiety. They learn to identify unrealistic or negative thoughts and behaviors and to replace them with positive ones. ? Acceptance and commitment therapy (ACT). This treatment teaches people how to be mindful as a way to cope with unwanted thoughts and feelings. ?  Biofeedback. This process trains you to manage your body's response (physiological response) through breathing techniques and relaxation methods. You will work with a therapist while machines are used to monitor your physical symptoms.  Stress management techniques. These include yoga, meditation, and exercise. A mental health specialist can help determine which treatment is best for you. Some people see improvement with one type of therapy. However, other people require a combination of therapies. Follow these instructions at home:  Take over-the-counter and prescription medicines only as told by your health care provider.  Try to maintain a normal routine.  Try to anticipate stressful situations and allow extra time to manage them.  Practice any stress management or self-calming techniques as taught by your health care provider.  Do not punish yourself for setbacks or for not making progress.  Try to recognize your accomplishments, even if they are small.  Keep all follow-up visits as told by your health care provider. This is important. Contact a health care provider if:  Your symptoms do not get better.  Your symptoms get worse.  You have signs of depression, such as: ? A persistently sad, cranky,  or irritable mood. ? Loss of enjoyment in activities that used to bring you joy. ? Change in weight or eating. ? Changes in sleeping habits. ? Avoiding friends or family members. ? Loss of energy for normal tasks. ? Feelings of guilt or worthlessness. Get help right away if:  You have serious thoughts about hurting yourself or others. If you ever feel like you may hurt yourself or others, or have thoughts about taking your own life, get help right away. You can go to your nearest emergency department or call:  Your local emergency services (911 in the [place]  A suicide crisis helpline, such as the [place] at 650-231-5968. This is open 24 hours a  day. Summary  Generalized anxiety disorder (GAD) is a mental health disorder that involves worry that is not triggered by a specific event.  People with GAD often worry excessively about many things in their lives, such as their health and family.  GAD may cause physical symptoms such as restlessness, trouble concentrating, sleep problems, frequent sweating, nausea, diarrhea, headaches, and trembling or muscle twitching.  A mental health specialist can help determine which treatment is best for you. Some people see improvement with one type of therapy. However, other people require a combination of therapies. This information is not intended to replace advice given to you by your health care provider. Make sure you discuss any questions you have with your health care provider. Document Released: 09/27/2012 Document Revised: 05/15/2017 Document Reviewed: 04/22/2016 Elsevier Patient Education  2020 [employer].

## 2019-05-27 DIAGNOSIS — Z0000 Encounter for general adult medical examination without abnormal findings: Secondary | ICD-10-CM | POA: Insufficient documentation

## 2019-05-27 DIAGNOSIS — R600 Localized edema: Secondary | ICD-10-CM | POA: Insufficient documentation

## 2019-08-03 DIAGNOSIS — Z1231 Encounter for screening mammogram for malignant neoplasm of breast: Secondary | ICD-10-CM | POA: Diagnosis not present

## 2019-08-03 DIAGNOSIS — Z01419 Encounter for gynecological examination (general) (routine) without abnormal findings: Secondary | ICD-10-CM | POA: Diagnosis not present

## 2019-08-03 DIAGNOSIS — Z6837 Body mass index (BMI) 37.0-37.9, adult: Secondary | ICD-10-CM | POA: Diagnosis not present

## 2019-08-10 ENCOUNTER — Other Ambulatory Visit: Payer: Self-pay

## 2019-08-10 ENCOUNTER — Telehealth: Payer: Self-pay | Admitting: Family Medicine

## 2019-08-10 DIAGNOSIS — R600 Localized edema: Secondary | ICD-10-CM

## 2019-08-10 LAB — COMPLETE METABOLIC PANEL WITH GFR
AG Ratio: 1.8 (calc) (ref 1.0–2.5)
ALT: 7 U/L (ref 6–29)
AST: 8 U/L — ABNORMAL LOW (ref 10–30)
Albumin: 4.1 g/dL (ref 3.6–5.1)
Alkaline phosphatase (APISO): 50 U/L (ref 31–125)
BUN: 14 mg/dL (ref 7–25)
CO2: 27 mmol/L (ref 20–32)
Calcium: 8.8 mg/dL (ref 8.6–10.2)
Chloride: 106 mmol/L (ref 98–110)
Creat: 0.9 mg/dL (ref 0.50–1.10)
GFR, Est African American: 91 mL/min/{1.73_m2} (ref >=60)
GFR, Est Non African American: 79 mL/min/{1.73_m2} (ref >=60)
Globulin: 2.3 g/dL (calc) (ref 1.9–3.7)
Glucose, Bld: 96 mg/dL (ref 65–99)
Potassium: 4.2 mmol/L (ref 3.5–5.3)
Sodium: 139 mmol/L (ref 135–146)
Total Bilirubin: 1.1 mg/dL (ref 0.2–1.2)
Total Protein: 6.4 g/dL (ref 6.1–8.1)

## 2019-08-10 LAB — CBC WITH DIFFERENTIAL/PLATELET
Absolute Monocytes: 248 cells/uL (ref 200–950)
Basophils Absolute: 18 cells/uL (ref 0–200)
Basophils Relative: 0.4 %
Eosinophils Absolute: 140 cells/uL (ref 15–500)
Eosinophils Relative: 3.1 %
HCT: 39.4 % (ref 35.0–45.0)
Hemoglobin: 13.1 g/dL (ref 11.7–15.5)
Lymphs Abs: 1377 cells/uL (ref 850–3900)
MCH: 29 pg (ref 27.0–33.0)
MCHC: 33.2 g/dL (ref 32.0–36.0)
MCV: 87.2 fL (ref 80.0–100.0)
MPV: 11.3 fL (ref 7.5–12.5)
Monocytes Relative: 5.5 %
Neutro Abs: 2718 cells/uL (ref 1500–7800)
Neutrophils Relative %: 60.4 %
Platelets: 211 10*3/uL (ref 140–400)
RBC: 4.52 10*6/uL (ref 3.80–5.10)
RDW: 12.7 % (ref 11.0–15.0)
Total Lymphocyte: 30.6 %
WBC: 4.5 10*3/uL (ref 3.8–10.8)

## 2019-08-10 LAB — LIPID PANEL
Cholesterol: 143 mg/dL (ref <200)
HDL: 45 mg/dL — ABNORMAL LOW (ref >=50)
LDL Cholesterol (Calc): 84 mg/dL (calc)
Non-HDL Cholesterol (Calc): 98 mg/dL (calc) (ref <130)
Total CHOL/HDL Ratio: 3.2 (calc) (ref <5.0)
Triglycerides: 56 mg/dL (ref <150)

## 2019-08-10 LAB — TSH: TSH: 1.58 mIU/L

## 2019-08-10 MED ORDER — FUROSEMIDE 20 MG PO TABS: 20.0000 mg | ORAL_TABLET | Freq: Every day | ORAL | 0 refills | Status: DC | PRN

## 2019-08-10 NOTE — Telephone Encounter (Signed)
Pt is requesting a refill on the following medication.  furosemide (LASIX) 20 MG tablet    [facility] [place], [place] - [address] Phone:  [phone]  Fax:  [phone]

## 2019-08-11 NOTE — Progress Notes (Signed)
Patient notified of results, verbalized understanding

## 2019-09-21 DIAGNOSIS — R600 Localized edema: Secondary | ICD-10-CM | POA: Diagnosis not present

## 2019-09-21 DIAGNOSIS — E6609 Other obesity due to excess calories: Secondary | ICD-10-CM | POA: Diagnosis not present

## 2019-09-21 DIAGNOSIS — Z0189 Encounter for other specified special examinations: Secondary | ICD-10-CM | POA: Diagnosis not present

## 2019-09-21 DIAGNOSIS — Z6837 Body mass index (BMI) 37.0-37.9, adult: Secondary | ICD-10-CM | POA: Diagnosis not present

## 2019-10-16 ENCOUNTER — Other Ambulatory Visit: Payer: Self-pay | Admitting: Family Medicine

## 2019-10-16 DIAGNOSIS — R600 Localized edema: Secondary | ICD-10-CM

## 2019-10-17 NOTE — Telephone Encounter (Signed)
Please advise 

## 2019-11-24 ENCOUNTER — Ambulatory Visit: Payer: BC Managed Care – PPO | Admitting: Family Medicine

## 2019-12-03 ENCOUNTER — Other Ambulatory Visit: Payer: Self-pay | Admitting: Family Medicine

## 2019-12-03 DIAGNOSIS — R600 Localized edema: Secondary | ICD-10-CM

## 2020-01-16 DIAGNOSIS — Z0001 Encounter for general adult medical examination with abnormal findings: Secondary | ICD-10-CM | POA: Diagnosis not present

## 2020-01-16 DIAGNOSIS — Z23 Encounter for immunization: Secondary | ICD-10-CM | POA: Diagnosis not present

## 2020-03-05 DIAGNOSIS — N6320 Unspecified lump in the left breast, unspecified quadrant: Secondary | ICD-10-CM | POA: Diagnosis not present

## 2020-03-07 ENCOUNTER — Other Ambulatory Visit: Payer: Self-pay | Admitting: Obstetrics and Gynecology

## 2020-03-07 DIAGNOSIS — N6489 Other specified disorders of breast: Secondary | ICD-10-CM

## 2020-03-23 ENCOUNTER — Ambulatory Visit
Admission: RE | Admit: 2020-03-23 | Discharge: 2020-03-23 | Disposition: A | Discharge status: Home / Self Care | Payer: BC Managed Care – PPO | Source: Ambulatory Visit | Attending: Obstetrics and Gynecology | Admitting: Obstetrics and Gynecology

## 2020-03-23 ENCOUNTER — Other Ambulatory Visit: Payer: Self-pay

## 2020-03-23 DIAGNOSIS — N6489 Other specified disorders of breast: Secondary | ICD-10-CM

## 2020-03-23 DIAGNOSIS — R928 Other abnormal and inconclusive findings on diagnostic imaging of breast: Secondary | ICD-10-CM | POA: Diagnosis not present

## 2020-04-04 DIAGNOSIS — R232 Flushing: Secondary | ICD-10-CM | POA: Diagnosis not present

## 2020-04-04 DIAGNOSIS — Z6837 Body mass index (BMI) 37.0-37.9, adult: Secondary | ICD-10-CM | POA: Diagnosis not present

## 2020-04-04 DIAGNOSIS — S161XXD Strain of muscle, fascia and tendon at neck level, subsequent encounter: Secondary | ICD-10-CM | POA: Diagnosis not present

## 2020-04-04 DIAGNOSIS — R600 Localized edema: Secondary | ICD-10-CM | POA: Diagnosis not present

## 2020-04-04 DIAGNOSIS — E6609 Other obesity due to excess calories: Secondary | ICD-10-CM | POA: Diagnosis not present

## 2020-04-04 DIAGNOSIS — N6489 Other specified disorders of breast: Secondary | ICD-10-CM | POA: Diagnosis not present

## 2020-04-04 DIAGNOSIS — Z0189 Encounter for other specified special examinations: Secondary | ICD-10-CM | POA: Diagnosis not present

## 2020-04-11 DIAGNOSIS — M9902 Segmental and somatic dysfunction of thoracic region: Secondary | ICD-10-CM | POA: Diagnosis not present

## 2020-04-11 DIAGNOSIS — M546 Pain in thoracic spine: Secondary | ICD-10-CM | POA: Diagnosis not present

## 2020-04-11 DIAGNOSIS — M542 Cervicalgia: Secondary | ICD-10-CM | POA: Diagnosis not present

## 2020-04-11 DIAGNOSIS — M9901 Segmental and somatic dysfunction of cervical region: Secondary | ICD-10-CM | POA: Diagnosis not present

## 2020-04-16 DIAGNOSIS — M9902 Segmental and somatic dysfunction of thoracic region: Secondary | ICD-10-CM | POA: Diagnosis not present

## 2020-04-16 DIAGNOSIS — M9901 Segmental and somatic dysfunction of cervical region: Secondary | ICD-10-CM | POA: Diagnosis not present

## 2020-04-16 DIAGNOSIS — M546 Pain in thoracic spine: Secondary | ICD-10-CM | POA: Diagnosis not present

## 2020-04-16 DIAGNOSIS — M542 Cervicalgia: Secondary | ICD-10-CM | POA: Diagnosis not present

## 2020-04-20 DIAGNOSIS — M9902 Segmental and somatic dysfunction of thoracic region: Secondary | ICD-10-CM | POA: Diagnosis not present

## 2020-04-20 DIAGNOSIS — M542 Cervicalgia: Secondary | ICD-10-CM | POA: Diagnosis not present

## 2020-04-20 DIAGNOSIS — M546 Pain in thoracic spine: Secondary | ICD-10-CM | POA: Diagnosis not present

## 2020-04-20 DIAGNOSIS — M9901 Segmental and somatic dysfunction of cervical region: Secondary | ICD-10-CM | POA: Diagnosis not present

## 2020-05-04 ENCOUNTER — Encounter (HOSPITAL_COMMUNITY): Payer: Self-pay | Admitting: Emergency Medicine

## 2020-05-04 ENCOUNTER — Emergency Department (HOSPITAL_COMMUNITY): Payer: BC Managed Care – PPO

## 2020-05-04 ENCOUNTER — Other Ambulatory Visit: Payer: Self-pay

## 2020-05-04 ENCOUNTER — Emergency Department (HOSPITAL_COMMUNITY)
Admission: EM | Admit: 2020-05-04 | Discharge: 2020-05-04 | Disposition: A | Discharge status: Home / Self Care | Payer: BC Managed Care – PPO | Attending: Emergency Medicine | Admitting: Emergency Medicine

## 2020-05-04 DIAGNOSIS — R0600 Dyspnea, unspecified: Secondary | ICD-10-CM

## 2020-05-04 DIAGNOSIS — R0602 Shortness of breath: Secondary | ICD-10-CM | POA: Diagnosis not present

## 2020-05-04 DIAGNOSIS — U071 COVID-19: Secondary | ICD-10-CM

## 2020-05-04 DIAGNOSIS — Z87891 Personal history of nicotine dependence: Secondary | ICD-10-CM | POA: Insufficient documentation

## 2020-05-04 MED ORDER — DIPHENHYDRAMINE HCL 50 MG/ML IJ SOLN: 50.0000 mg | Freq: Once | INTRAMUSCULAR | Status: DC | PRN

## 2020-05-04 MED ORDER — SODIUM CHLORIDE 0.9 % IV SOLN: INTRAVENOUS | Status: DC | PRN

## 2020-05-04 MED ORDER — FAMOTIDINE IN NACL 20-0.9 MG/50ML-% IV SOLN: 20.0000 mg | Freq: Once | INTRAVENOUS | Status: DC | PRN

## 2020-05-04 MED ORDER — SODIUM CHLORIDE 0.9 % IV SOLN
1200.0000 mg | Freq: Once | INTRAVENOUS | Status: AC
  Administered 2020-05-04: 1200 mg via INTRAVENOUS
  Filled 2020-05-04: qty 10

## 2020-05-04 MED ORDER — METHYLPREDNISOLONE SODIUM SUCC 125 MG IJ SOLR: 125.0000 mg | Freq: Once | INTRAMUSCULAR | Status: DC | PRN

## 2020-05-04 MED ORDER — EPINEPHRINE 0.3 MG/0.3ML IJ SOAJ: 0.3000 mg | Freq: Once | INTRAMUSCULAR | Status: DC | PRN

## 2020-05-04 MED ORDER — ALBUTEROL SULFATE HFA 108 (90 BASE) MCG/ACT IN AERS: 2.0000 | INHALATION_SPRAY | Freq: Once | RESPIRATORY_TRACT | Status: DC | PRN

## 2020-05-04 NOTE — ED Triage Notes (Signed)
Pt reports SHOB and cough since Sunday. Pt Dx with COVID on Sunday.

## 2020-05-04 NOTE — Discharge Instructions (Addendum)
Continue over-the-counter medications as needed for relief of symptoms.  Isolate at home until your symptoms are gone +1-week.  Return to the emergency department for any new and/or concerning symptoms.    Person Under Monitoring Name: Bethany Wilson  Location: [address] [place] [address]   Infection Prevention Recommendations for Individuals Confirmed to have, or Being Evaluated for, 2019 Novel Coronavirus (COVID-19) Infection Who Receive Care at Home  Individuals who are confirmed to have, or are being evaluated for, COVID-19 should follow the prevention steps below until a healthcare provider or local or state health department says they can return to normal activities.  Stay home except to get medical care You should restrict activities outside your home, except for getting medical care. Do not go to work, school, or public areas, and do not use public transportation or taxis.  Call ahead before visiting your doctor Before your medical appointment, call the healthcare provider and tell them that you have, or are being evaluated for, COVID-19 infection. This will help the healthcare provider's office take steps to keep other people from getting infected. Ask your healthcare provider to call the local or state health department.  Monitor your symptoms Seek prompt medical attention if your illness is worsening (e.g., difficulty breathing). Before going to your medical appointment, call the healthcare provider and tell them that you have, or are being evaluated for, COVID-19 infection. Ask your healthcare provider to call the local or state health department.  Wear a facemask You should wear a facemask that covers your nose and mouth when you are in the same room with other people and when you visit a healthcare provider. People who live with or visit you should also wear a facemask while they are in the same room with you.  Separate yourself from other people in  your home As much as possible, you should stay in a different room from other people in your home. Also, you should use a separate bathroom, if available.  Avoid sharing household items You should not share dishes, drinking glasses, cups, eating utensils, towels, bedding, or other items with other people in your home. After using these items, you should wash them thoroughly with soap and water.  Cover your coughs and sneezes Cover your mouth and nose with a tissue when you cough or sneeze, or you can cough or sneeze into your sleeve. Throw used tissues in a lined trash can, and immediately wash your hands with soap and water for at least 20 seconds or use an alcohol-based hand rub.  Wash your [employer] your hands often and thoroughly with soap and water for at least 20 seconds. You can use an alcohol-based hand sanitizer if soap and water are not available and if your hands are not visibly dirty. Avoid touching your eyes, nose, and mouth with unwashed hands.   Prevention Steps for Caregivers and Household Members of Individuals Confirmed to have, or Being Evaluated for, COVID-19 Infection Being Cared for in the Home  If you live with, or provide care at home for, a person confirmed to have, or being evaluated for, COVID-19 infection please follow these guidelines to prevent infection:  Follow healthcare provider's instructions Make sure that you understand and can help the patient follow any healthcare provider instructions for all care.  Provide for the patient's basic needs You should help the patient with basic needs in the home and provide support for getting groceries, prescriptions, and other personal needs.  Monitor the patient's symptoms  If they are getting sicker, call his or her medical provider and tell them that the patient has, or is being evaluated for, COVID-19 infection. This will help the healthcare provider's office take steps to keep other people from getting  infected. Ask the healthcare provider to call the local or state health department.  Limit the number of people who have contact with the patient If possible, have only one caregiver for the patient. Other household members should stay in another home or place of residence. If this is not possible, they should stay in another room, or be separated from the patient as much as possible. Use a separate bathroom, if available. Restrict visitors who do not have an essential need to be in the home.  Keep older adults, very young children, and other sick people away from the patient Keep older adults, very young children, and those who have compromised immune systems or chronic health conditions away from the patient. This includes people with chronic heart, lung, or kidney conditions, diabetes, and cancer.  Ensure good ventilation Make sure that shared spaces in the home have good air flow, such as from an air conditioner or an opened window, weather permitting.  Wash your hands often Wash your hands often and thoroughly with soap and water for at least 20 seconds. You can use an alcohol based hand sanitizer if soap and water are not available and if your hands are not visibly dirty. Avoid touching your eyes, nose, and mouth with unwashed hands. Use disposable paper towels to dry your hands. If not available, use dedicated cloth towels and replace them when they become wet.  Wear a facemask and gloves Wear a disposable facemask at all times in the room and gloves when you touch or have contact with the patient's blood, body fluids, and/or secretions or excretions, such as sweat, saliva, sputum, nasal mucus, vomit, urine, or feces.  Ensure the mask fits over your nose and mouth tightly, and do not touch it during use. Throw out disposable facemasks and gloves after using them. Do not reuse. Wash your hands immediately after removing your facemask and gloves. If your personal clothing becomes  contaminated, carefully remove clothing and launder. Wash your hands after handling contaminated clothing. Place all used disposable facemasks, gloves, and other waste in a lined container before disposing them with other household waste. Remove gloves and wash your hands immediately after handling these items.  Do not share dishes, glasses, or other household items with the patient Avoid sharing household items. You should not share dishes, drinking glasses, cups, eating utensils, towels, bedding, or other items with a patient who is confirmed to have, or being evaluated for, COVID-19 infection. After the person uses these items, you should wash them thoroughly with soap and water.  Wash laundry thoroughly Immediately remove and wash clothes or bedding that have blood, body fluids, and/or secretions or excretions, such as sweat, saliva, sputum, nasal mucus, vomit, urine, or feces, on them. Wear gloves when handling laundry from the patient. Read and follow directions on labels of laundry or clothing items and detergent. In general, wash and dry with the warmest temperatures recommended on the label.  Clean all areas the individual has used often Clean all touchable surfaces, such as counters, tabletops, doorknobs, bathroom fixtures, toilets, phones, keyboards, tablets, and bedside tables, every day. Also, clean any surfaces that may have blood, body fluids, and/or secretions or excretions on them. Wear gloves when cleaning surfaces the patient has come in contact with.  Use a diluted bleach solution (e.g., dilute bleach with 1 part bleach and 10 parts water) or a household disinfectant with a label that says EPA-registered for coronaviruses. To make a bleach solution at home, add 1 tablespoon of bleach to 1 quart (4 cups) of water. For a larger supply, add  cup of bleach to 1 gallon (16 cups) of water. Read labels of cleaning products and follow recommendations provided on product labels. Labels  contain instructions for safe and effective use of the cleaning product including precautions you should take when applying the product, such as wearing gloves or eye protection and making sure you have good ventilation during use of the product. Remove gloves and wash hands immediately after cleaning.  Monitor yourself for signs and symptoms of illness Caregivers and household members are considered close contacts, should monitor their health, and will be asked to limit movement outside of the home to the extent possible. Follow the monitoring steps for close contacts listed on the symptom monitoring form.   ? If you have additional questions, contact your local health department or call the epidemiologist on call at [phone] (available 24/7). ? This guidance is subject to change. For the most up-to-date guidance from [facility], please refer to their website: [email or web address]hu

## 2020-05-04 NOTE — ED Provider Notes (Signed)
[facility] EMERGENCY DEPARTMENT Provider Note   CSN: [id number] Arrival date & time: 05/04/20  0751     History Chief Complaint  Patient presents with  . Shortness of Breath    COVID +    Bethany Wilson is a 44 y.o. female.  Patient is a 44 year old female with past medical history of anxiety.  She presents today for evaluation of shortness of breath.  Patient has been experiencing URI symptoms for the past week.  She tested positive for COVID-19 5 days ago despite having received vaccinations.  Since yesterday, she has had worsening dyspnea.  She denies any productive cough, she does report fevers at home to 103.  The history is provided by the patient.  Shortness of Breath Severity:  Moderate Onset quality:  Gradual Duration:  2 days Timing:  Constant Progression:  Worsening Chronicity:  New Relieved by:  Nothing Worsened by:  Nothing Ineffective treatments:  None tried Associated symptoms: cough and fever        Past Medical History:  Diagnosis Date  . 'light-for-dates' infant with signs of fetal malnutrition   . 'light-for-dates' infant with signs of fetal malnutrition   . Abdominal pain, generalized   . Acute tonsillitis   . Anxiety   . Anxiety state   . Gastro-esophageal reflux disease with esophagitis   . Headache   . Low back pain   . Medical history non-contributory   . Newborn product of in vitro fertilization (IVF) pregnancy   . Obesity, unspecified   . Panic disorder without agoraphobia   . Spasm of muscle     Patient Active Problem List   Diagnosis Date Noted  . Bilateral leg edema 05/27/2019  . Health maintenance examination 05/27/2019  . Gastro-esophageal reflux disease with esophagitis   . Headache   . Anxiety state   . Low back pain   . Obesity, unspecified   . H/O rapid labor 02/04/2017    Past Surgical History:  Procedure Laterality Date  . ABDOMINAL HYSTERECTOMY    . CHOLECYSTECTOMY    . TONSILLECTOMY    . TUBAL  LIGATION    . unlisted laproscopy procedure spermatic cord       OB History    Gravida  4   Para  4   Term  4   Preterm      AB      Living  4     SAB      TAB      Ectopic      Multiple  0   Live Births  4           Family History  Problem Relation Age of Onset  . Hypertension Mother   . Breast cancer Mother   . Diabetes Mother   . Cancer Mother   . Hypertension Father   . Heart disease Paternal Grandmother     Social History   Tobacco Use  . Smoking status: Former Games developer  . Smokeless tobacco: Never Used  Substance Use Topics  . Alcohol use: No  . Drug use: No    Home Medications Prior to Admission medications   Medication Sig Start Date End Date Taking? Authorizing Provider  ALPRAZolam Prudy Feeler) 1 MG tablet Take 1 mg by mouth 4 (four) times daily as needed for anxiety (as needed for severe anxiety).    [provider]  furosemide (LASIX) 20 MG tablet TAKE 1 TABLET BY MOUTH  DAILY AS NEEDED FOR FLUID  OR EDEMA 10/18/19  Corum, Minerva Fester, MD  ibuprofen (ADVIL,MOTRIN) 600 MG tablet Take 1 tablet (600 mg total) by mouth every 6 (six) hours. Patient not taking: Reported on 05/26/2019 02/06/17   Mitchel Honour, DO    Allergies    Other and Shellfish allergy  Review of Systems   Review of Systems  Constitutional: Positive for fever.  Respiratory: Positive for cough and shortness of breath.   All other systems reviewed and are negative.   Physical Exam Updated Vital Signs Pulse 91   Ht 5\' 7"  (1.702 m)   Wt 104.3 kg   LMP 05/01/2020   SpO2 99%   BMI 36.02 kg/m   Physical Exam Vitals and nursing note reviewed.  Constitutional:      General: She is not in acute distress.    Appearance: She is well-developed. She is not diaphoretic.  HENT:     Head: Normocephalic and atraumatic.  Cardiovascular:     Rate and Rhythm: Normal rate and regular rhythm.     Heart sounds: No murmur heard.  No friction rub. No gallop.   Pulmonary:      Effort: Pulmonary effort is normal. No respiratory distress.     Breath sounds: Normal breath sounds. No wheezing.  Abdominal:     General: Bowel sounds are normal. There is no distension.     Palpations: Abdomen is soft.     Tenderness: There is no abdominal tenderness.  Musculoskeletal:        General: Normal range of motion.     Cervical back: Normal range of motion and neck supple.  Skin:    General: Skin is warm and dry.  Neurological:     Mental Status: She is alert and oriented to person, place, and time.     ED Results / Procedures / Treatments   Labs (all labs ordered are listed, but only abnormal results are displayed) Labs Reviewed  RESP PANEL BY RT-PCR (FLU A&B, COVID) [id number]    EKG None  Radiology No results found.  Procedures Procedures (including critical care time)  Medications Ordered in ED Medications  casirivimab-imdevimab (REGEN-COV) 1,200 mg in sodium chloride 0.9 % 110 mL IVPB (has no administration in time range)  0.9 %  sodium chloride infusion (has no administration in time range)  diphenhydrAMINE (BENADRYL) injection 50 mg (has no administration in time range)  famotidine (PEPCID) IVPB 20 mg premix (has no administration in time range)  methylPREDNISolone sodium succinate (SOLU-MEDROL) 125 mg/2 mL injection 125 mg (has no administration in time range)  albuterol (VENTOLIN HFA) 108 (90 Base) MCG/ACT inhaler 2 puff (has no administration in time range)  EPINEPHrine (EPI-PEN) injection 0.3 mg (has no administration in time range)    ED Course  I have reviewed the triage vital signs and the nursing notes.  Pertinent labs & imaging results that were available during my care of the patient were reviewed by me and considered in my medical decision making (see chart for details).    MDM Rules/Calculators/A&P  Patient presenting here with complaints of feeling short of breath.  She was diagnosed with Covid several days ago.  Patient's x-ray shows no  evidence for pneumonia and oxygen saturations are in the mid 90s.  Patient meets criteria for Mab infusion and is receptive to this.  Mab infusion administered and patient tolerated this well.  She seems appropriate for discharge.  To return as needed.  Bethany Wilson was evaluated in Emergency Department on 05/04/2020 for the symptoms described in the history  of present illness. She was evaluated in the context of the global COVID-19 pandemic, which necessitated consideration that the patient might be at risk for infection with the SARS-CoV-2 virus that causes COVID-19. Institutional protocols and algorithms that pertain to the evaluation of patients at risk for COVID-19 are in a state of rapid change based on information released by regulatory bodies including the CDC and federal and state organizations. These policies and algorithms were followed during the patient's care in the ED.   Final Clinical Impression(s) / ED Diagnoses Final diagnoses:  None    Rx / DC Orders ED Discharge Orders    None       Geoffery Lyons, MD 05/04/20 1146

## 2020-05-09 DIAGNOSIS — U071 COVID-19: Secondary | ICD-10-CM | POA: Diagnosis not present

## 2020-08-13 DIAGNOSIS — Z01419 Encounter for gynecological examination (general) (routine) without abnormal findings: Secondary | ICD-10-CM | POA: Diagnosis not present

## 2020-08-13 DIAGNOSIS — Z1231 Encounter for screening mammogram for malignant neoplasm of breast: Secondary | ICD-10-CM | POA: Diagnosis not present

## 2020-08-13 DIAGNOSIS — Z6837 Body mass index (BMI) 37.0-37.9, adult: Secondary | ICD-10-CM | POA: Diagnosis not present

## 2021-01-23 DIAGNOSIS — E669 Obesity, unspecified: Secondary | ICD-10-CM | POA: Diagnosis not present

## 2021-01-23 DIAGNOSIS — Z0000 Encounter for general adult medical examination without abnormal findings: Secondary | ICD-10-CM | POA: Diagnosis not present

## 2021-01-30 DIAGNOSIS — Z0001 Encounter for general adult medical examination with abnormal findings: Secondary | ICD-10-CM | POA: Diagnosis not present

## 2021-09-02 DIAGNOSIS — Z124 Encounter for screening for malignant neoplasm of cervix: Secondary | ICD-10-CM | POA: Diagnosis not present

## 2021-09-02 DIAGNOSIS — N926 Irregular menstruation, unspecified: Secondary | ICD-10-CM | POA: Diagnosis not present

## 2021-09-02 DIAGNOSIS — Z1231 Encounter for screening mammogram for malignant neoplasm of breast: Secondary | ICD-10-CM | POA: Diagnosis not present

## 2021-09-02 DIAGNOSIS — Z6838 Body mass index (BMI) 38.0-38.9, adult: Secondary | ICD-10-CM | POA: Diagnosis not present

## 2021-09-02 DIAGNOSIS — Z01419 Encounter for gynecological examination (general) (routine) without abnormal findings: Secondary | ICD-10-CM | POA: Diagnosis not present

## 2021-10-07 DIAGNOSIS — N840 Polyp of corpus uteri: Secondary | ICD-10-CM | POA: Diagnosis not present

## 2021-10-07 DIAGNOSIS — N924 Excessive bleeding in the premenopausal period: Secondary | ICD-10-CM | POA: Diagnosis not present

## 2021-12-11 DIAGNOSIS — N939 Abnormal uterine and vaginal bleeding, unspecified: Secondary | ICD-10-CM | POA: Diagnosis not present

## 2021-12-11 DIAGNOSIS — N840 Polyp of corpus uteri: Secondary | ICD-10-CM | POA: Diagnosis not present

## 2022-02-05 DIAGNOSIS — Z136 Encounter for screening for cardiovascular disorders: Secondary | ICD-10-CM | POA: Diagnosis not present

## 2022-02-05 DIAGNOSIS — Z0000 Encounter for general adult medical examination without abnormal findings: Secondary | ICD-10-CM | POA: Diagnosis not present

## 2022-02-11 DIAGNOSIS — Z0001 Encounter for general adult medical examination with abnormal findings: Secondary | ICD-10-CM | POA: Diagnosis not present

## 2022-05-21 ENCOUNTER — Ambulatory Visit
Admission: EM | Admit: 2022-05-21 | Discharge: 2022-05-21 | Disposition: A | Discharge status: Home / Self Care | Payer: BC Managed Care – PPO | Attending: Family Medicine | Admitting: Family Medicine

## 2022-05-21 DIAGNOSIS — N390 Urinary tract infection, site not specified: Secondary | ICD-10-CM | POA: Diagnosis not present

## 2022-05-21 LAB — POCT URINALYSIS DIP (MANUAL ENTRY)
Bilirubin, UA: NEGATIVE
Glucose, UA: NEGATIVE mg/dL
Ketones, POC UA: NEGATIVE mg/dL
Nitrite, UA: NEGATIVE
Protein Ur, POC: NEGATIVE mg/dL
Spec Grav, UA: <=1.005 — AB (ref 1.010–1.025)
Urobilinogen, UA: 0.2 E.U./dL
pH, UA: 5.5 (ref 5.0–8.0)

## 2022-05-21 MED ORDER — CEPHALEXIN 500 MG PO CAPS: 500.0000 mg | ORAL_CAPSULE | Freq: Two times a day (BID) | ORAL | 0 refills | Status: DC

## 2022-05-21 NOTE — ED Provider Notes (Signed)
[facility]    CSN: [id number] Arrival date & time: 05/21/22  0805      History   Chief Complaint No chief complaint on file.   HPI Bethany Wilson is a 46 y.o. female.   Patient presenting today with 1 day history of dysuria, suprapubic pressure, incomplete emptying of bladder.  Denies fever, chills, nausea, vomiting, hematuria, vaginal symptoms, flank pain.  So far not trying anything over-the-counter for symptoms.  History of UTIs that have started similarly.    Past Medical History:  Diagnosis Date   'light-for-dates' infant with signs of fetal malnutrition    'light-for-dates' infant with signs of fetal malnutrition    Abdominal pain, generalized    Acute tonsillitis    Anxiety    Anxiety state    Gastro-esophageal reflux disease with esophagitis    Headache    Low back pain    Medical history non-contributory    Newborn product of in vitro fertilization (IVF) pregnancy    Obesity, unspecified    Panic disorder without agoraphobia    Spasm of muscle     Patient Active Problem List   Diagnosis Date Noted   Bilateral leg edema 05/27/2019   Health maintenance examination 05/27/2019   Gastro-esophageal reflux disease with esophagitis    Headache    Anxiety state    Low back pain    Obesity, unspecified    H/O rapid labor 02/04/2017    Past Surgical History:  Procedure Laterality Date   ABDOMINAL HYSTERECTOMY     CHOLECYSTECTOMY     TONSILLECTOMY     TUBAL LIGATION     unlisted laproscopy procedure spermatic cord      OB History     Gravida  4   Para  4   Term  4   Preterm      AB      Living  4      SAB      IAB      Ectopic      Multiple  0   Live Births  4            Home Medications    Prior to Admission medications   Medication Sig Start Date End Date Taking? Authorizing Provider  cephALEXin (KEFLEX) 500 MG capsule Take 1 capsule (500 mg total) by mouth 2 (two) times daily. 05/21/22  Yes Particia Nearing, PA-C  ALPRAZolam Prudy Feeler) 1 MG tablet Take 1 mg by mouth 4 (four) times daily as needed for anxiety (as needed for severe anxiety).    [provider]  furosemide (LASIX) 20 MG tablet TAKE 1 TABLET BY MOUTH  DAILY AS NEEDED FOR FLUID  OR EDEMA 10/18/19   Corum, Minerva Fester, MD  ibuprofen (ADVIL,MOTRIN) 600 MG tablet Take 1 tablet (600 mg total) by mouth every 6 (six) hours. Patient not taking: Reported on 05/26/2019 02/06/17   Mitchel Honour, DO    Family History Family History  Problem Relation Age of Onset   Hypertension Mother    Breast cancer Mother    Diabetes Mother    Cancer Mother    Hypertension Father    Heart disease Paternal Grandmother     Social History Social History   Tobacco Use   Smoking status: Former   Smokeless tobacco: Never  Substance Use Topics   Alcohol use: No   Drug use: No     Allergies   Other and Shellfish allergy   Review of Systems Review  of Systems Per HPI  Physical Exam Triage Vital Signs ED Triage Vitals  Enc Vitals Group     BP 05/21/22 0814 139/88     Pulse Rate 05/21/22 0814 70     Resp 05/21/22 0814 20     Temp 05/21/22 0814 98.2 F (36.8 C)     Temp Source 05/21/22 0814 Oral     SpO2 05/21/22 0814 98 %     Weight --      Height --      Head Circumference --      Peak Flow --      Pain Score 05/21/22 0816 0     Pain Loc --      Pain Edu? --      Excl. in GC? --    No data found.  Updated Vital Signs BP 139/88 (BP Location: Right Arm)   Pulse 70   Temp 98.2 F (36.8 C) (Oral)   Resp 20   LMP 05/16/2022 (Exact Date) Comment: pt is just getting off MP start date 12/1  SpO2 98%   Visual Acuity Right Eye Distance:   Left Eye Distance:   Bilateral Distance:    Right Eye Near:   Left Eye Near:    Bilateral Near:     Physical Exam Vitals and nursing note reviewed.  Constitutional:      Appearance: Normal appearance. She is not ill-appearing.  HENT:     Head: Atraumatic.  Eyes:     Extraocular  Movements: Extraocular movements intact.     Conjunctiva/sclera: Conjunctivae normal.  Cardiovascular:     Rate and Rhythm: Normal rate and regular rhythm.     Heart sounds: Normal heart sounds.  Pulmonary:     Effort: Pulmonary effort is normal.     Breath sounds: Normal breath sounds.  Abdominal:     General: Bowel sounds are normal. There is no distension.     Palpations: Abdomen is soft.     Tenderness: There is no abdominal tenderness. There is no right CVA tenderness, left CVA tenderness or guarding.  Musculoskeletal:        General: Normal range of motion.     Cervical back: Normal range of motion and neck supple.  Skin:    General: Skin is warm and dry.  Neurological:     Mental Status: She is alert and oriented to person, place, and time.  Psychiatric:        Mood and Affect: Mood normal.        Thought Content: Thought content normal.        Judgment: Judgment normal.      UC Treatments / Results  Labs (all labs ordered are listed, but only abnormal results are displayed) Labs Reviewed  POCT URINALYSIS DIP (MANUAL ENTRY) - Abnormal; Notable for the following components:      Result Value   Spec Grav, UA <=1.005 (*)    Blood, UA moderate (*)    Leukocytes, UA Trace (*)    All other components within normal limits  URINE CULTURE    EKG   Radiology No results found.  Procedures Procedures (including critical care time)  Medications Ordered in UC Medications - No data to display  Initial Impression / Assessment and Plan / UC Course  I have reviewed the triage vital signs and the nursing notes.  Pertinent labs & imaging results that were available during my care of the patient were reviewed by me and considered in my medical decision  making (see chart for details).     Urinalysis today with evidence of a urinary tract infection developing.  Urine culture pending, treat with Keflex, Azo as needed, fluids and return for worsening symptoms.  Final  Clinical Impressions(s) / UC Diagnoses   Final diagnoses:  Acute lower UTI   Discharge Instructions   None    ED Prescriptions     Medication Sig Dispense Auth. Provider   cephALEXin (KEFLEX) 500 MG capsule Take 1 capsule (500 mg total) by mouth 2 (two) times daily. 10 capsule Particia Nearing, [place]      PDMP not reviewed this encounter.   Roosvelt Maser [place], [place] 05/21/22 [phone]

## 2022-05-21 NOTE — ED Triage Notes (Signed)
Pt reports she is having some burning, frequent urination, not emptying bladder all the way. Discomfort in low abdominal area. Took azo but no relief.

## 2022-05-23 LAB — URINE CULTURE: Culture: >=100000 — AB

## 2022-06-17 IMAGING — US US BREAST*L* LIMITED INC AXILLA
1 series · 5 of 5 positions shown · non-contrast
Comparison: 08/03/2019

CLINICAL DATA: Patient reports new fullness in the 12 o'clock
location of the LEFT breast 3 centimeters from the areola.

EXAM:
DIGITAL DIAGNOSTIC LEFT MAMMOGRAM WITH CAD AND TOMO
ULTRASOUND LEFT BREAST

[Series 1: us breast*left* limited inc axilla · 0.07mm/px · 5 of 5 slices shown]
[im 1/5]
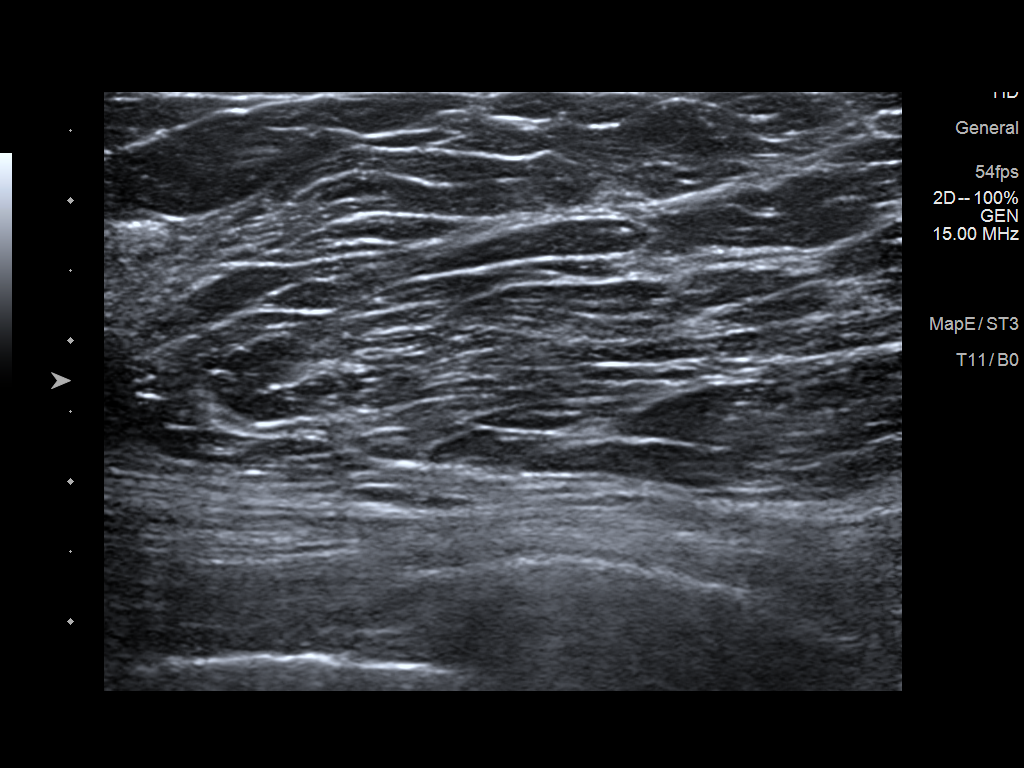
[im 2/5]
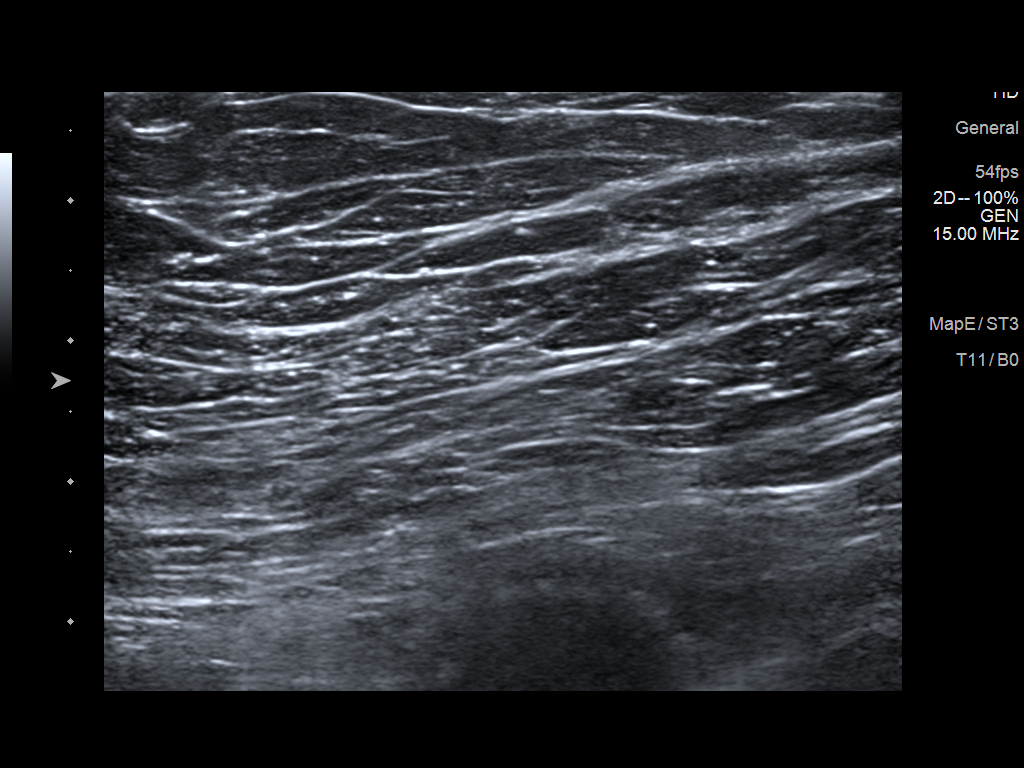
[im 3/5]
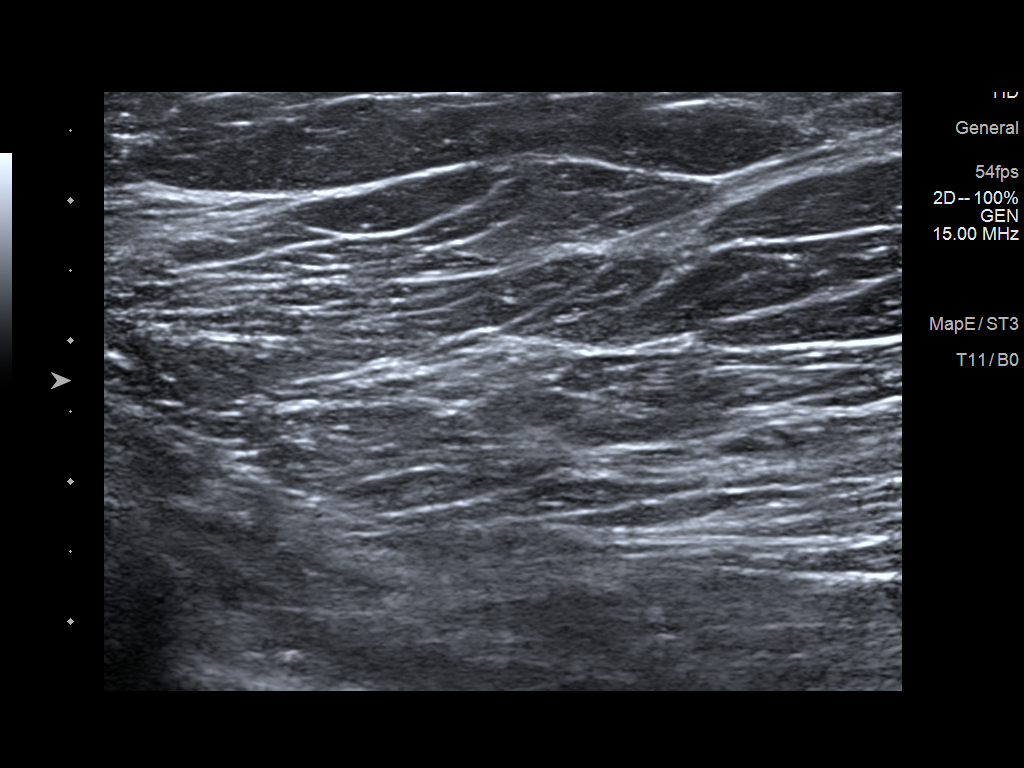
[im 4/5]
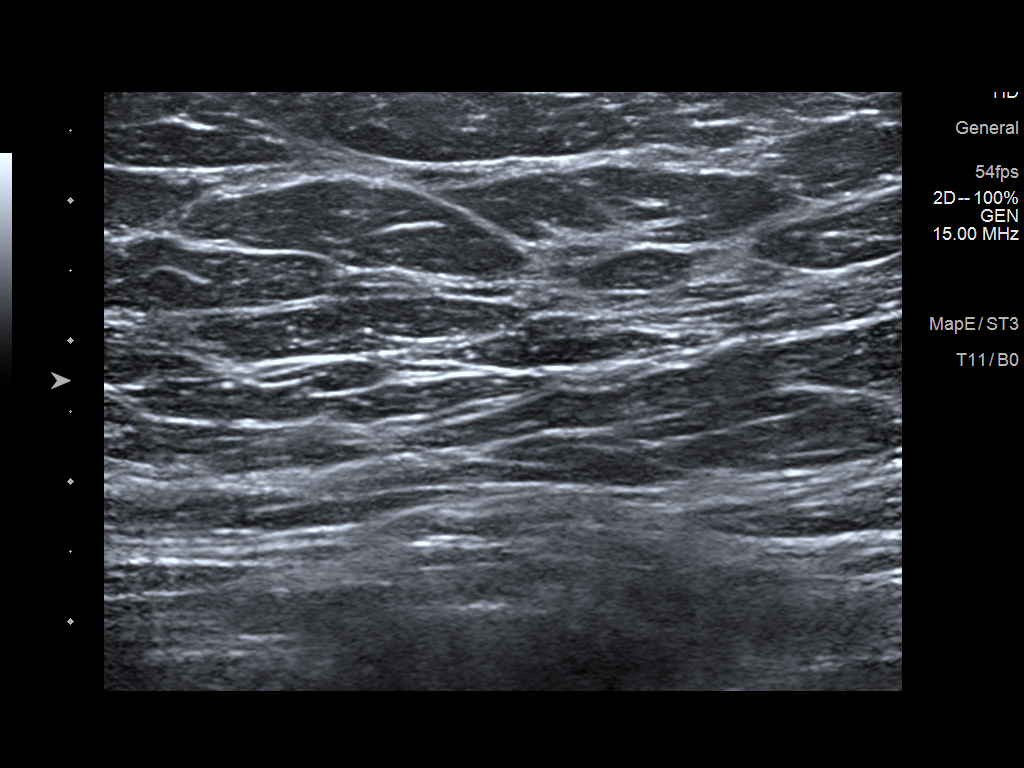
[im 5/5]
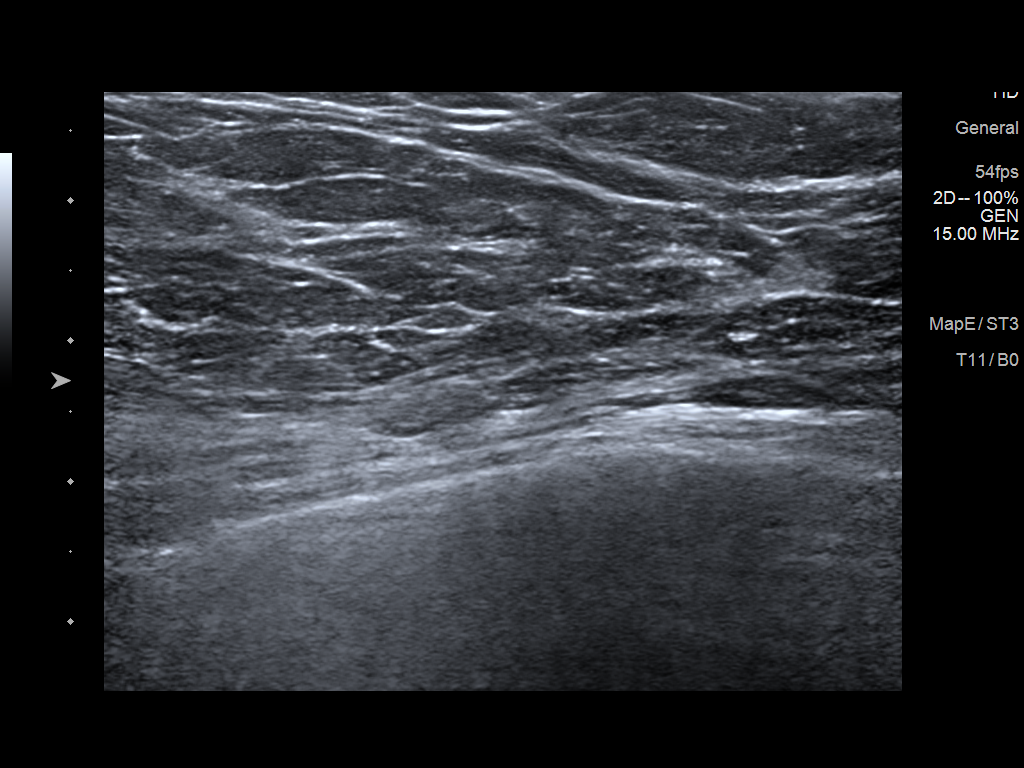

[5 of 5 positions shown; findings below may reference images not displayed]

ACR Breast Density Category b: There are scattered areas of
fibroglandular density.
FINDINGS: No suspicious mass, distortion, or microcalcifications are
identified to suggest presence of malignancy.

Mammographic images were processed with CAD.

On physical exam, there is slight convexity in the UPPER-OUTER
QUADRANT of the LEFT breast, best identified with patient sitting.
In this region I palpate no discrete mass.

Targeted ultrasound is performed, showing normal appearing
fibroglandular tissue in the UPPER-OUTER QUADRANT of the LEFT
breast. No suspicious mass, distortion, or acoustic shadowing is
demonstrated with ultrasound.
IMPRESSION: No mammographic or ultrasound evidence for malignancy.

RECOMMENDATION:
Recommend screening mammogram in July 2020.

I have discussed the findings and recommendations with the patient.
If applicable, a reminder letter will be sent to the patient
regarding the next appointment.

BI-RADS CATEGORY  1: Negative.

## 2022-06-17 IMAGING — MG MM DIGITAL DIAGNOSTIC UNILAT*L* W/ TOMO W/ CAD
4 series · 4 of 12 positions shown · non-contrast
Comparison: 08/03/2019

CLINICAL DATA: Patient reports new fullness in the 12 o'clock
location of the LEFT breast 3 centimeters from the areola.

EXAM:
DIGITAL DIAGNOSTIC LEFT MAMMOGRAM WITH CAD AND TOMO
ULTRASOUND LEFT BREAST

[L CC synth-2D]
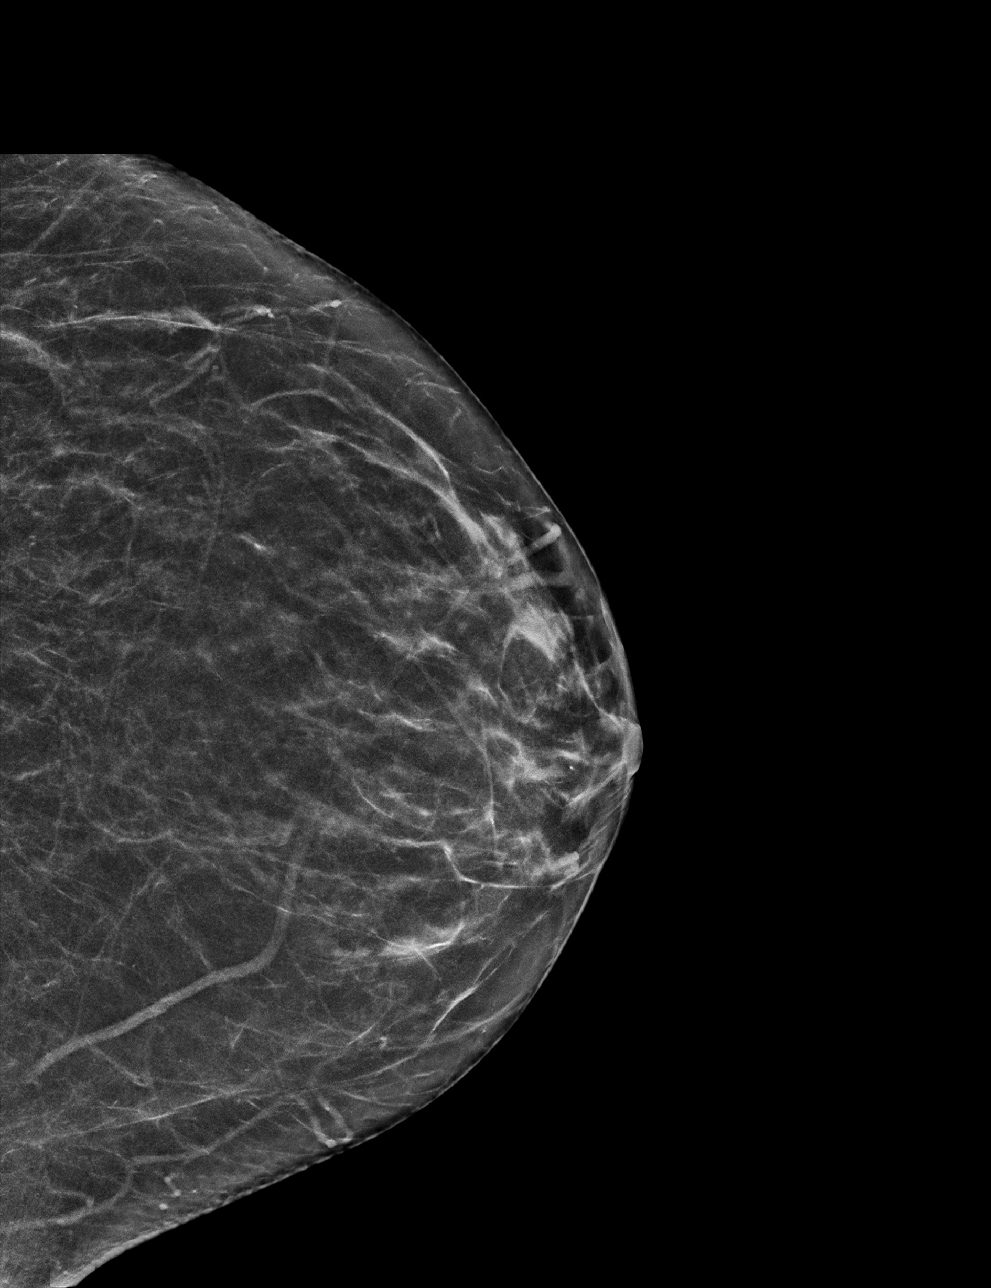

[L MLO synth-2D]
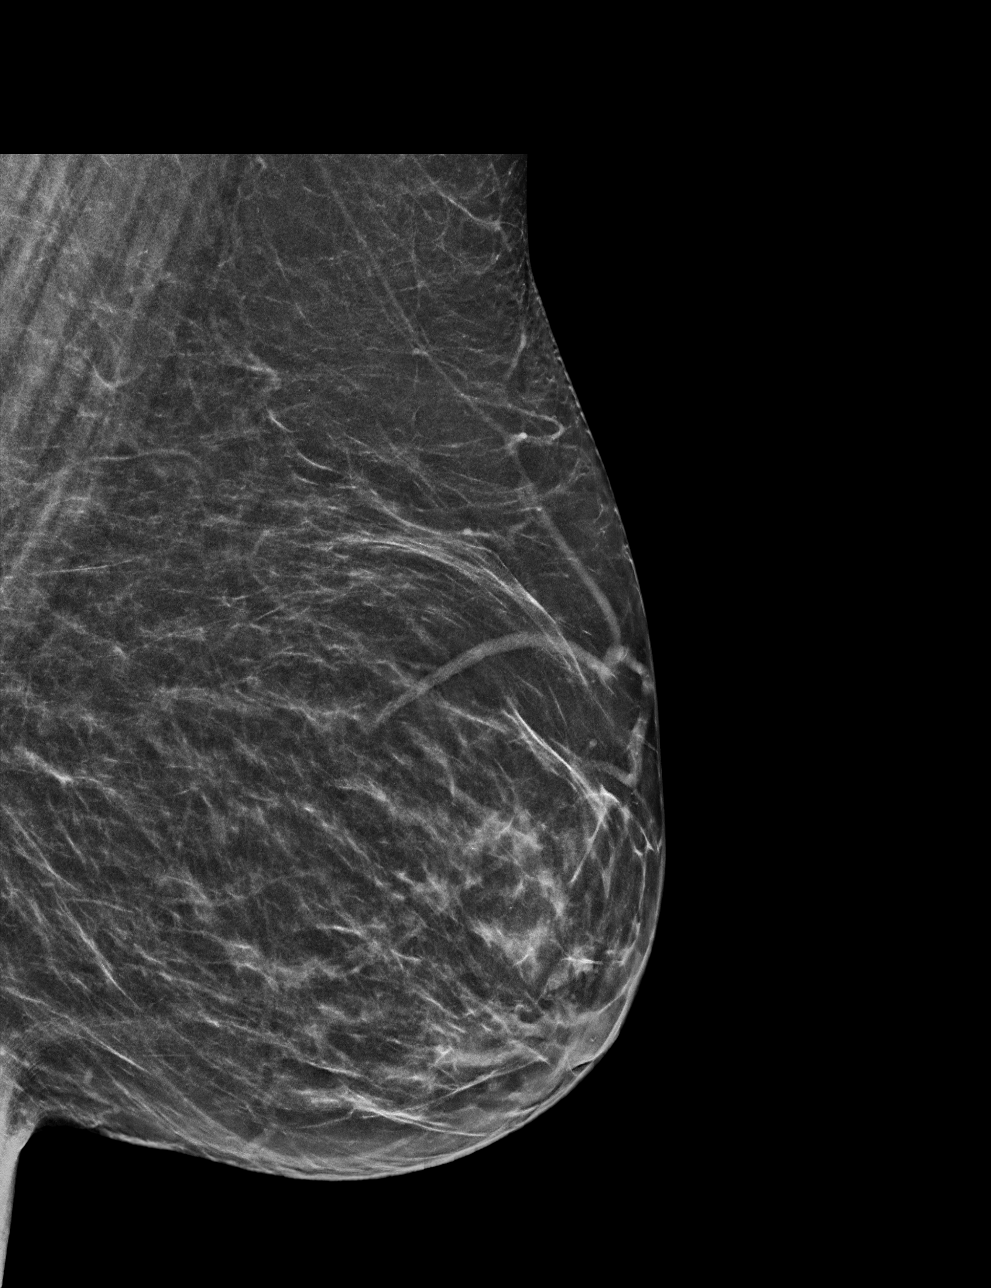

[L MLO tomo · tomo slice 31/60.0]
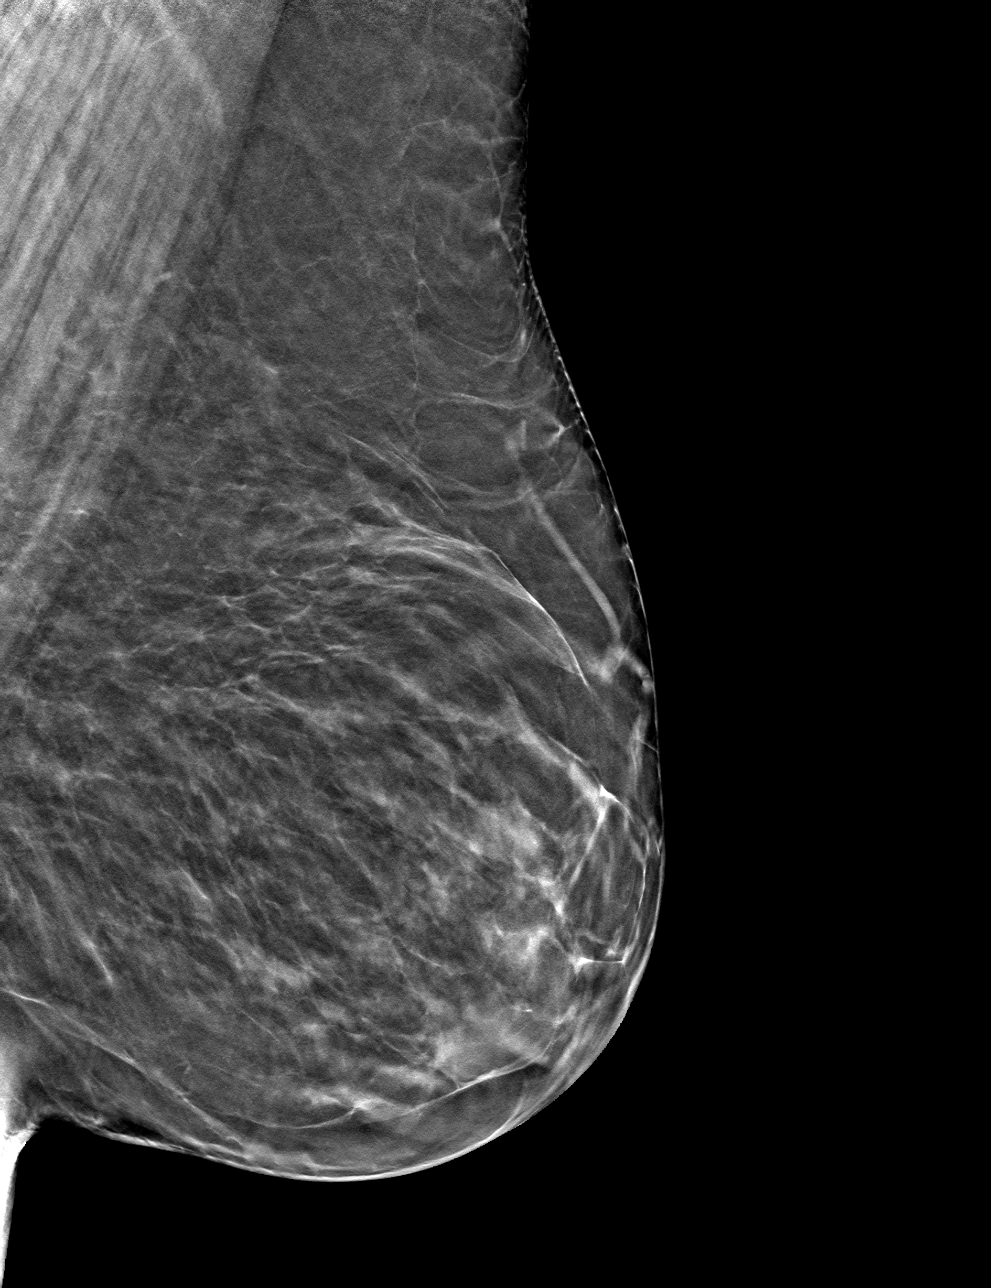

[L CC tomo · tomo slice 27/54.0]
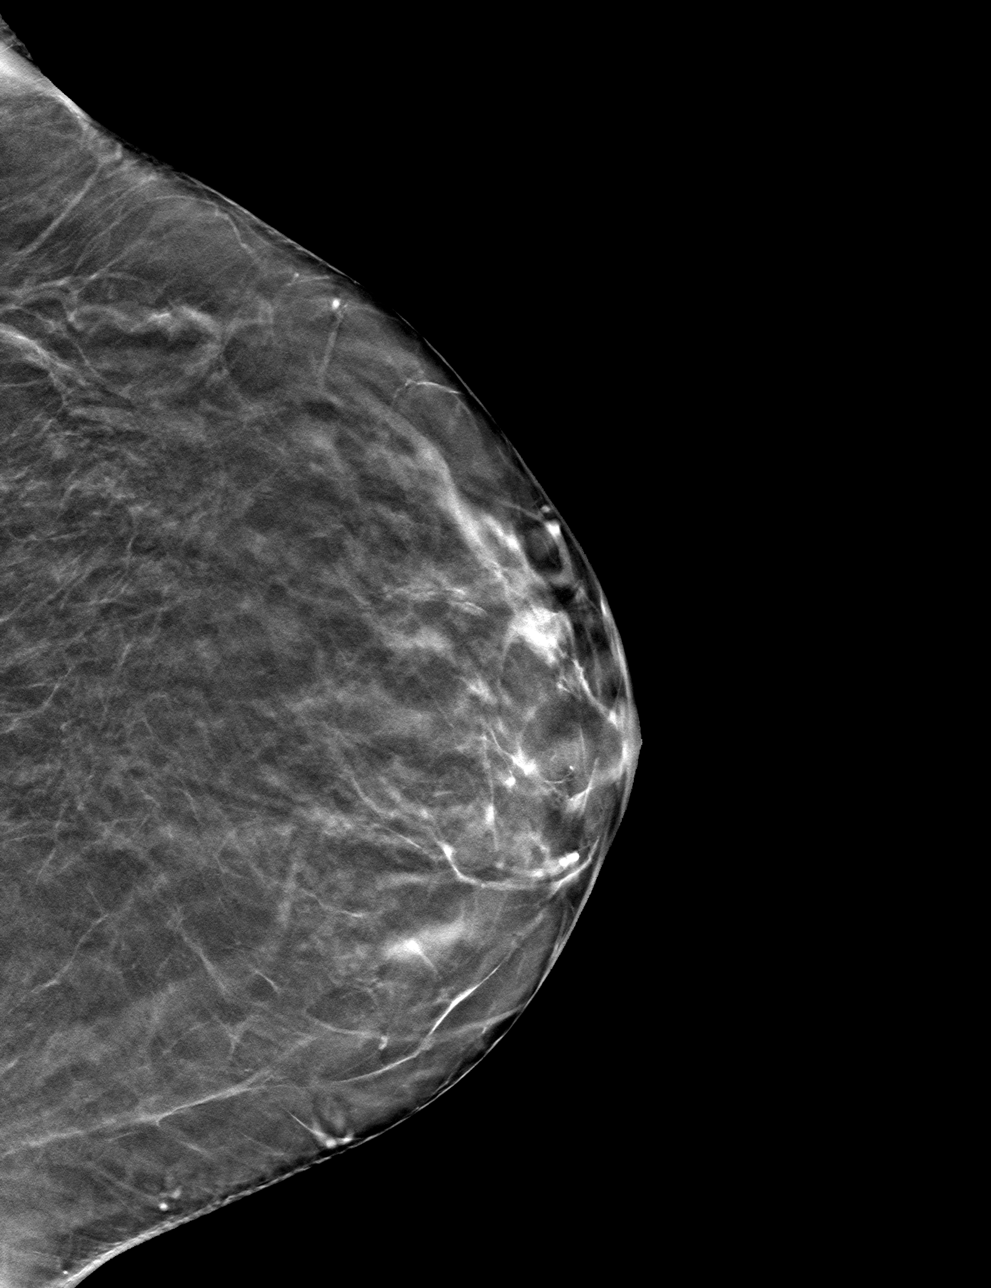

[4 of 12 positions shown; findings below may reference images not displayed]

ACR Breast Density Category b: There are scattered areas of
fibroglandular density.
FINDINGS: No suspicious mass, distortion, or microcalcifications are
identified to suggest presence of malignancy.

Mammographic images were processed with CAD.

On physical exam, there is slight convexity in the UPPER-OUTER
QUADRANT of the LEFT breast, best identified with patient sitting.
In this region I palpate no discrete mass.

Targeted ultrasound is performed, showing normal appearing
fibroglandular tissue in the UPPER-OUTER QUADRANT of the LEFT
breast. No suspicious mass, distortion, or acoustic shadowing is
demonstrated with ultrasound.
IMPRESSION: No mammographic or ultrasound evidence for malignancy.

RECOMMENDATION:
Recommend screening mammogram in July 2020.

I have discussed the findings and recommendations with the patient.
If applicable, a reminder letter will be sent to the patient
regarding the next appointment.

BI-RADS CATEGORY  1: Negative.

## 2022-08-28 DIAGNOSIS — J302 Other seasonal allergic rhinitis: Secondary | ICD-10-CM | POA: Diagnosis not present

## 2022-08-28 DIAGNOSIS — R059 Cough, unspecified: Secondary | ICD-10-CM | POA: Diagnosis not present

## 2022-08-28 DIAGNOSIS — R030 Elevated blood-pressure reading, without diagnosis of hypertension: Secondary | ICD-10-CM | POA: Diagnosis not present

## 2022-08-31 DIAGNOSIS — R030 Elevated blood-pressure reading, without diagnosis of hypertension: Secondary | ICD-10-CM | POA: Diagnosis not present

## 2022-08-31 DIAGNOSIS — R0602 Shortness of breath: Secondary | ICD-10-CM | POA: Diagnosis not present

## 2022-08-31 DIAGNOSIS — J069 Acute upper respiratory infection, unspecified: Secondary | ICD-10-CM | POA: Diagnosis not present

## 2022-10-02 DIAGNOSIS — Z124 Encounter for screening for malignant neoplasm of cervix: Secondary | ICD-10-CM | POA: Diagnosis not present

## 2022-10-02 DIAGNOSIS — Z01419 Encounter for gynecological examination (general) (routine) without abnormal findings: Secondary | ICD-10-CM | POA: Diagnosis not present

## 2022-10-02 DIAGNOSIS — Z6839 Body mass index (BMI) 39.0-39.9, adult: Secondary | ICD-10-CM | POA: Diagnosis not present

## 2022-10-27 DIAGNOSIS — Z1231 Encounter for screening mammogram for malignant neoplasm of breast: Secondary | ICD-10-CM | POA: Diagnosis not present

## 2022-11-11 DIAGNOSIS — K921 Melena: Secondary | ICD-10-CM | POA: Diagnosis not present

## 2022-11-11 DIAGNOSIS — M79672 Pain in left foot: Secondary | ICD-10-CM | POA: Diagnosis not present

## 2022-11-11 DIAGNOSIS — T148XXA Other injury of unspecified body region, initial encounter: Secondary | ICD-10-CM | POA: Diagnosis not present

## 2022-11-11 DIAGNOSIS — R58 Hemorrhage, not elsewhere classified: Secondary | ICD-10-CM | POA: Diagnosis not present

## 2022-11-11 DIAGNOSIS — R5383 Other fatigue: Secondary | ICD-10-CM | POA: Diagnosis not present

## 2022-11-27 DIAGNOSIS — N920 Excessive and frequent menstruation with regular cycle: Secondary | ICD-10-CM | POA: Diagnosis not present

## 2022-12-01 DIAGNOSIS — N888 Other specified noninflammatory disorders of cervix uteri: Secondary | ICD-10-CM | POA: Diagnosis not present

## 2022-12-01 DIAGNOSIS — N9412 Deep dyspareunia: Secondary | ICD-10-CM | POA: Diagnosis not present

## 2022-12-01 DIAGNOSIS — N945 Secondary dysmenorrhea: Secondary | ICD-10-CM | POA: Diagnosis not present

## 2022-12-01 DIAGNOSIS — N920 Excessive and frequent menstruation with regular cycle: Secondary | ICD-10-CM | POA: Diagnosis not present

## 2022-12-01 DIAGNOSIS — N8003 Adenomyosis of the uterus: Secondary | ICD-10-CM | POA: Diagnosis not present

## 2022-12-22 ENCOUNTER — Ambulatory Visit: Payer: BC Managed Care – PPO | Admitting: Gastroenterology

## 2023-01-11 NOTE — Progress Notes (Signed)
Referring Provider: Benita Stabile, MD Primary Care Physician:  Benita Stabile, MD Primary Gastroenterologist:  Dr. Tasia Catchings  Chief Complaint  Patient presents with   Blood In Stools    Was having bloody stools.     HPI:   Bethany Wilson is a 47 y.o. female presenting today at the request of Margo Aye, Kathleene Hazel, MD for consult colonoscopy, hematochezia.   Reviewed OV with Leone Payor, NP 11/09/22. Patient reported 3 episodes of hematochezia in the last month. Reported hemorrhoids in the past. Recommended seeing GI for colonoscopy. Labs also updatred. Hgb was wnl at 14.2, iron panel wnl.   Today: Bright red blood in the toilet water x 3 occurences in May with no recurrence. No rectal pain. No constipation or abdominal pain. She does have hemorrhoids, but they didn't seem to be flared at that time.   No other GI concerns.  Denies heartburn, dysphagia, nausea, vomiting, unintentional weight loss.  No family history of colon cancer.   Past Medical History:  Diagnosis Date   'Light-for-dates' infant with signs of fetal malnutrition    'Light-for-dates' infant with signs of fetal malnutrition    Abdominal pain, generalized    Acute tonsillitis    Anxiety    Anxiety state    Gastro-esophageal reflux disease with esophagitis    Headache    Low back pain    Medical history non-contributory    Newborn product of in vitro fertilization (IVF) pregnancy    Obesity, unspecified    Panic disorder without agoraphobia    Spasm of muscle     Past Surgical History:  Procedure Laterality Date   ABDOMINAL HYSTERECTOMY     CHOLECYSTECTOMY     TONSILLECTOMY     TUBAL LIGATION     unlisted laproscopy procedure spermatic cord      Current Outpatient Medications  Medication Sig Dispense Refill   albuterol (VENTOLIN HFA) 108 (90 Base) MCG/ACT inhaler INHALE 1 PUFF BY MOUTH EVERY 4 TO 6 HOURS AS NEEDED FOR SHORTNESS OF BREATH OR WHEEZING     furosemide (LASIX) 20 MG tablet TAKE 1 TABLET BY  MOUTH  DAILY AS NEEDED FOR FLUID  OR EDEMA 90 tablet 0   meloxicam (MOBIC) 15 MG tablet Take 15 mg by mouth daily. 2-3 times a week.     No current facility-administered medications for this visit.    Allergies as of 01/14/2023 - Review Complete 01/14/2023  Allergen Reaction Noted   Other  10/12/2012   Shellfish allergy Hives 10/12/2012    Family History  Problem Relation Age of Onset   Hypertension Mother    Breast cancer Mother    Diabetes Mother    Cancer Mother    Hypertension Father    Heart disease Paternal Grandmother    Colon cancer Neg Hx     Social History   Socioeconomic History   Marital status: Married    Spouse name: Not on file   Number of children: Not on file   Years of education: Not on file   Highest education level: Not on file  Occupational History   Occupation: hairstylist  Tobacco Use   Smoking status: Former   Smokeless tobacco: Never  Substance and Sexual Activity   Alcohol use: No   Drug use: No   Sexual activity: Yes    Birth control/protection: None  Other Topics Concern   Not on file  Social History Narrative   Not on file   Social Determinants of Health  Financial Resource Strain: Not on file  Food Insecurity: Not on file  Transportation Needs: Not on file  Physical Activity: Not on file  Stress: Not on file  Social Connections: Not on file  Intimate Partner Violence: Not on file    Review of Systems: Gen: Denies any fever, chills, cold or flulike symptoms, presyncope, syncope. CV: Denies chest pain, heart palpitations. Resp: Denies shortness of breath, cough. GI: See HPI GU : Denies urinary burning, urinary frequency, urinary hesitancy MS: Denies joint pain.   Derm: Denies rash. Psych: Denies depression, anxiety. Heme: See HPI  Physical Exam: BP 109/77 (BP Location: Right Arm, Patient Position: Sitting, Cuff Size: Large)   Pulse 76   Temp 97.7 F (36.5 C) (Temporal)   Ht 5\' 6"  (1.676 m)   Wt 251 lb (113.9 kg)    LMP 10/27/2022 Comment: hysterectomy  SpO2 98%   BMI 40.51 kg/m  General:   Alert and oriented. Pleasant and cooperative. Well-nourished and well-developed.  Head:  Normocephalic and atraumatic. Eyes:  Without icterus, sclera clear and conjunctiva pink.  Ears:  Normal auditory acuity. Lungs:  Clear to auscultation bilaterally. No wheezes, rales, or rhonchi. No distress.  Heart:  S1, S2 present without murmurs appreciated.  Abdomen:  +BS, soft, non-tender and non-distended. No HSM noted. No guarding or rebound. No masses appreciated.  Rectal:  Deferred  Msk:  Symmetrical without gross deformities. Normal posture. Extremities:  Without edema. Neurologic:  Alert and  oriented x4;  grossly normal neurologically. Skin:  Intact without significant lesions or rashes. Psych:  Normal mood and affect.    Assessment:  47 year old female presenting today to discuss scheduling first-ever screening colonoscopy.  Incidentally, she had 3 episodes of bright red blood per rectum in May with blood in the toilet water.  No recurrent symptoms.  She does report history of hemorrhoids, but denies any rectal pain at that time.  No other significant GI symptoms.  No family history of colon cancer.  Prior episodes of rectal bleeding may very well be secondary to a benign anorectal source such as hemorrhoids, but unable to rule out colon polyps or malignancy.  We will arrange colonoscopy in the near future.   Plan:  Proceed with colonoscopy with propofol by Dr. Tasia Catchings in the near future. The risks, benefits, and alternatives have been discussed with the patient in detail. The patient states understanding and desires to proceed.  ASA 3 Follow-up per Dr. Marcelino Freestone recommendations.    Ermalinda Memos, PA-C [facility] 01/14/2023

## 2023-01-11 NOTE — H&P (View-Only) (Signed)
Referring Provider: Benita Stabile, MD Primary Care Physician:  Benita Stabile, MD Primary Gastroenterologist:  Dr. Tasia Catchings  Chief Complaint  Patient presents with   Blood In Stools    Was having bloody stools.     HPI:   Bethany Wilson is a 47 y.o. female presenting today at the request of Margo Aye, Kathleene Hazel, MD for consult colonoscopy, hematochezia.   Reviewed OV with Leone Payor, NP 11/09/22. Patient reported 3 episodes of hematochezia in the last month. Reported hemorrhoids in the past. Recommended seeing GI for colonoscopy. Labs also updatred. Hgb was wnl at 14.2, iron panel wnl.   Today: Bright red blood in the toilet water x 3 occurences in May with no recurrence. No rectal pain. No constipation or abdominal pain. She does have hemorrhoids, but they didn't seem to be flared at that time.   No other GI concerns.  Denies heartburn, dysphagia, nausea, vomiting, unintentional weight loss.  No family history of colon cancer.   Past Medical History:  Diagnosis Date   'Light-for-dates' infant with signs of fetal malnutrition    'Light-for-dates' infant with signs of fetal malnutrition    Abdominal pain, generalized    Acute tonsillitis    Anxiety    Anxiety state    Gastro-esophageal reflux disease with esophagitis    Headache    Low back pain    Medical history non-contributory    Newborn product of in vitro fertilization (IVF) pregnancy    Obesity, unspecified    Panic disorder without agoraphobia    Spasm of muscle     Past Surgical History:  Procedure Laterality Date   ABDOMINAL HYSTERECTOMY     CHOLECYSTECTOMY     TONSILLECTOMY     TUBAL LIGATION     unlisted laproscopy procedure spermatic cord      Current Outpatient Medications  Medication Sig Dispense Refill   albuterol (VENTOLIN HFA) 108 (90 Base) MCG/ACT inhaler INHALE 1 PUFF BY MOUTH EVERY 4 TO 6 HOURS AS NEEDED FOR SHORTNESS OF BREATH OR WHEEZING     furosemide (LASIX) 20 MG tablet TAKE 1 TABLET BY  MOUTH  DAILY AS NEEDED FOR FLUID  OR EDEMA 90 tablet 0   meloxicam (MOBIC) 15 MG tablet Take 15 mg by mouth daily. 2-3 times a week.     No current facility-administered medications for this visit.    Allergies as of 01/14/2023 - Review Complete 01/14/2023  Allergen Reaction Noted   Other  10/12/2012   Shellfish allergy Hives 10/12/2012    Family History  Problem Relation Age of Onset   Hypertension Mother    Breast cancer Mother    Diabetes Mother    Cancer Mother    Hypertension Father    Heart disease Paternal Grandmother    Colon cancer Neg Hx     Social History   Socioeconomic History   Marital status: Married    Spouse name: Not on file   Number of children: Not on file   Years of education: Not on file   Highest education level: Not on file  Occupational History   Occupation: hairstylist  Tobacco Use   Smoking status: Former   Smokeless tobacco: Never  Substance and Sexual Activity   Alcohol use: No   Drug use: No   Sexual activity: Yes    Birth control/protection: None  Other Topics Concern   Not on file  Social History Narrative   Not on file   Social Determinants of Health  Financial Resource Strain: Not on file  Food Insecurity: Not on file  Transportation Needs: Not on file  Physical Activity: Not on file  Stress: Not on file  Social Connections: Not on file  Intimate Partner Violence: Not on file    Review of Systems: Gen: Denies any fever, chills, cold or flulike symptoms, presyncope, syncope. CV: Denies chest pain, heart palpitations. Resp: Denies shortness of breath, cough. GI: See HPI GU : Denies urinary burning, urinary frequency, urinary hesitancy MS: Denies joint pain.   Derm: Denies rash. Psych: Denies depression, anxiety. Heme: See HPI  Physical Exam: BP 109/77 (BP Location: Right Arm, Patient Position: Sitting, Cuff Size: Large)   Pulse 76   Temp 97.7 F (36.5 C) (Temporal)   Ht 5\' 6"  (1.676 m)   Wt 251 lb (113.9 kg)    LMP 10/27/2022 Comment: hysterectomy  SpO2 98%   BMI 40.51 kg/m  General:   Alert and oriented. Pleasant and cooperative. Well-nourished and well-developed.  Head:  Normocephalic and atraumatic. Eyes:  Without icterus, sclera clear and conjunctiva pink.  Ears:  Normal auditory acuity. Lungs:  Clear to auscultation bilaterally. No wheezes, rales, or rhonchi. No distress.  Heart:  S1, S2 present without murmurs appreciated.  Abdomen:  +BS, soft, non-tender and non-distended. No HSM noted. No guarding or rebound. No masses appreciated.  Rectal:  Deferred  Msk:  Symmetrical without gross deformities. Normal posture. Extremities:  Without edema. Neurologic:  Alert and  oriented x4;  grossly normal neurologically. Skin:  Intact without significant lesions or rashes. Psych:  Normal mood and affect.    Assessment:  47 year old female presenting today to discuss scheduling first-ever screening colonoscopy.  Incidentally, she had 3 episodes of bright red blood per rectum in May with blood in the toilet water.  No recurrent symptoms.  She does report history of hemorrhoids, but denies any rectal pain at that time.  No other significant GI symptoms.  No family history of colon cancer.  Prior episodes of rectal bleeding may very well be secondary to a benign anorectal source such as hemorrhoids, but unable to rule out colon polyps or malignancy.  We will arrange colonoscopy in the near future.   Plan:  Proceed with colonoscopy with propofol by Dr. Tasia Catchings in the near future. The risks, benefits, and alternatives have been discussed with the patient in detail. The patient states understanding and desires to proceed.  ASA 3 Follow-up per Dr. Marcelino Freestone recommendations.    Ermalinda Memos, PA-C [facility] 01/14/2023

## 2023-01-14 ENCOUNTER — Other Ambulatory Visit: Payer: Self-pay | Admitting: *Deleted

## 2023-01-14 ENCOUNTER — Ambulatory Visit (INDEPENDENT_AMBULATORY_CARE_PROVIDER_SITE_OTHER): Payer: BC Managed Care – PPO | Admitting: Gastroenterology

## 2023-01-14 ENCOUNTER — Encounter: Payer: Self-pay | Admitting: Gastroenterology

## 2023-01-14 ENCOUNTER — Encounter: Payer: Self-pay | Admitting: *Deleted

## 2023-01-14 VITALS — BP 109/77 | HR 76 | Temp 97.7°F | Ht 66.0 in | Wt 251.0 lb

## 2023-01-14 DIAGNOSIS — K625 Hemorrhage of anus and rectum: Secondary | ICD-10-CM | POA: Diagnosis not present

## 2023-01-14 MED ORDER — PEG 3350-KCL-NA BICARB-NACL 420 G PO SOLR: 4000.0000 mL | Freq: Once | ORAL | 0 refills | Status: AC

## 2023-01-14 NOTE — Patient Instructions (Signed)
We will arrange for you to have a colonoscopy in the near future with Dr. Tasia Catchings.  We will see you back in the office as Dr. Tasia Catchings recommends.  It was a pleasure to meet you today! I want to create trusting relationships with patients. If you receive a survey regarding your visit,  I greatly appreciate you taking time to fill this out on paper or through your MyChart. I value your feedback.  Ermalinda Memos, PA-C [facility]

## 2023-01-15 ENCOUNTER — Telehealth: Payer: Self-pay | Admitting: *Deleted

## 2023-01-15 NOTE — Telephone Encounter (Signed)
Pt was called and given pre-op date. She states she is working that day and will need to reschedule. Pt was provided with the number to call and reschedule.

## 2023-01-16 NOTE — Patient Instructions (Signed)
Bethany Wilson  01/16/2023     @PREFPERIOPPHARMACY @   Your procedure is scheduled on  01/22/2023.   Report to [facility] at  1200  P.M.   Call this number if you have problems the morning of surgery:  [phone]  If you experience any cold or flu symptoms such as cough, fever, chills, shortness of breath, etc. between now and your scheduled surgery, please notify us at the above number.   Remember:  Follow the diet and prep instructions given to you by the office.     Take these medicines the morning of surgery with A SIP OF WATER                                     mobic(if needed).     Do not wear jewelry, make-up or nail polish, including gel polish,  artificial nails, or any other type of covering on natural nails (fingers and  toes).  Do not wear lotions, powders, or perfumes, or deodorant.  Do not shave 48 hours prior to surgery.  Men may shave face and neck.  Do not bring valuables to the hospital.  [facility] is not responsible for any belongings or valuables.  Contacts, dentures or bridgework may not be worn into surgery.  Leave your suitcase in the car.  After surgery it may be brought to your room.  For patients admitted to the hospital, discharge time will be determined by your treatment team.  Patients discharged the day of surgery will not be allowed to drive home and must have someone with them for 24 hours.    Special instructions:   DO NOT smoke tobacco or vape for 24 hours before your procedure.  Please read over the following fact sheets that you were given. Anesthesia Post-op Instructions and Care and Recovery After Surgery      Colonoscopy, Adult, Care After The following information offers guidance on how to care for yourself after your procedure. Your health care provider may also give you more specific instructions. If you have problems or questions, contact your health care provider. What can I expect after the  procedure? After the procedure, it is common to have: A small amount of blood in your stool for 24 hours after the procedure. Some gas. Mild cramping or bloating of your abdomen. Follow these instructions at home: Eating and drinking  Drink enough fluid to keep your urine pale yellow. Follow instructions from your health care provider about eating or drinking restrictions. Resume your normal diet as told by your health care provider. Avoid heavy or fried foods that are hard to digest. Activity Rest as told by your health care provider. Avoid sitting for a long time without moving. Get up to take short walks every 1-2 hours. This is important to improve blood flow and breathing. Ask for help if you feel weak or unsteady. Return to your normal activities as told by your health care provider. Ask your health care provider what activities are safe for you. Managing cramping and bloating  Try walking around when you have cramps or feel bloated. If directed, apply heat to your abdomen as told by your health care provider. Use the heat source that your health care provider recommends, such as a moist heat pack or a heating pad. Place a towel between your skin and the heat source. Leave the heat on  for 20-30 minutes. Remove the heat if your skin turns bright red. This is especially important if you are unable to feel pain, heat, or cold. You have a greater risk of getting burned. General instructions If you were given a sedative during the procedure, it can affect you for several hours. Do not drive or operate machinery until your health care provider says that it is safe. For the first 24 hours after the procedure: Do not sign important documents. Do not drink alcohol. Do your regular daily activities at a slower pace than normal. Eat soft foods that are easy to digest. Take over-the-counter and prescription medicines only as told by your health care provider. Keep all follow-up visits. This  is important. Contact a health care provider if: You have blood in your stool 2-3 days after the procedure. Get help right away if: You have more than a small spotting of blood in your stool. You have large blood clots in your stool. You have swelling of your abdomen. You have nausea or vomiting. You have a fever. You have increasing pain in your abdomen that is not relieved with medicine. These symptoms may be an emergency. Get help right away. Call 911. Do not wait to see if the symptoms will go away. Do not drive yourself to the hospital. Summary After the procedure, it is common to have a small amount of blood in your stool. You may also have mild cramping and bloating of your abdomen. If you were given a sedative during the procedure, it can affect you for several hours. Do not drive or operate machinery until your health care provider says that it is safe. Get help right away if you have a lot of blood in your stool, nausea or vomiting, a fever, or increased pain in your abdomen. This information is not intended to replace advice given to you by your health care provider. Make sure you discuss any questions you have with your health care provider. Document Revised: 07/15/2022 Document Reviewed: 01/23/2021 Elsevier Patient Education  2024 [employer] Monitored Anesthesia Care, Care After The following information offers guidance on how to care for yourself after your procedure. Your health care provider may also give you more specific instructions. If you have problems or questions, contact your health care provider. What can I expect after the procedure? After the procedure, it is common to have: Tiredness. Little or no memory about what happened during or after the procedure. Impaired judgment when it comes to making decisions. Nausea or vomiting. Some trouble with balance. Follow these instructions at home: For the time period you were told by your health care  provider:  Rest. Do not participate in activities where you could fall or become injured. Do not drive or use machinery. Do not drink alcohol. Do not take sleeping pills or medicines that cause drowsiness. Do not make important decisions or sign legal documents. Do not take care of children on your own. Medicines Take over-the-counter and prescription medicines only as told by your health care provider. If you were prescribed antibiotics, take them as told by your health care provider. Do not stop using the antibiotic even if you start to feel better. Eating and drinking Follow instructions from your health care provider about what you may eat and drink. Drink enough fluid to keep your urine pale yellow. If you vomit: Drink clear fluids slowly and in small amounts as you are able. Clear fluids include water, ice chips, low-calorie sports drinks, and fruit juice  that has water added to it (diluted fruit juice). Eat light and bland foods in small amounts as you are able. These foods include bananas, applesauce, rice, lean meats, toast, and crackers. General instructions  Have a responsible adult stay with you for the time you are told. It is important to have someone help care for you until you are awake and alert. If you have sleep apnea, surgery and some medicines can increase your risk for breathing problems. Follow instructions from your health care provider about wearing your sleep device: When you are sleeping. This includes during daytime naps. While taking prescription pain medicines, sleeping medicines, or medicines that make you drowsy. Do not use any products that contain nicotine or tobacco. These products include cigarettes, chewing tobacco, and vaping devices, such as e-cigarettes. If you need help quitting, ask your health care provider. Contact a health care provider if: You feel nauseous or vomit every time you eat or drink. You feel light-headed. You are still sleepy or  having trouble with balance after 24 hours. You get a rash. You have a fever. You have redness or swelling around the IV site. Get help right away if: You have trouble breathing. You have new confusion after you get home. These symptoms may be an emergency. Get help right away. Call 911. Do not wait to see if the symptoms will go away. Do not drive yourself to the hospital. This information is not intended to replace advice given to you by your health care provider. Make sure you discuss any questions you have with your health care provider. Document Revised: 10/28/2021 Document Reviewed: 10/28/2021 Elsevier Patient Education  2024 [employer].

## 2023-01-19 ENCOUNTER — Encounter (HOSPITAL_COMMUNITY)
Admission: RE | Admit: 2023-01-19 | Discharge: 2023-01-19 | Disposition: A | Discharge status: Home / Self Care | Payer: BC Managed Care – PPO | Source: Ambulatory Visit | Attending: Gastroenterology | Admitting: Gastroenterology

## 2023-01-19 VITALS — BP 109/77 | HR 76 | Temp 97.7°F | Resp 18 | Ht 66.0 in | Wt 251.0 lb

## 2023-01-19 DIAGNOSIS — R600 Localized edema: Secondary | ICD-10-CM | POA: Diagnosis not present

## 2023-01-19 DIAGNOSIS — K6389 Other specified diseases of intestine: Secondary | ICD-10-CM | POA: Diagnosis not present

## 2023-01-19 DIAGNOSIS — Z87891 Personal history of nicotine dependence: Secondary | ICD-10-CM | POA: Diagnosis not present

## 2023-01-19 DIAGNOSIS — Z01812 Encounter for preprocedural laboratory examination: Secondary | ICD-10-CM | POA: Insufficient documentation

## 2023-01-19 DIAGNOSIS — Z6841 Body Mass Index (BMI) 40.0 and over, adult: Secondary | ICD-10-CM | POA: Diagnosis not present

## 2023-01-19 DIAGNOSIS — Z79899 Other long term (current) drug therapy: Secondary | ICD-10-CM | POA: Diagnosis not present

## 2023-01-19 DIAGNOSIS — K648 Other hemorrhoids: Secondary | ICD-10-CM | POA: Diagnosis not present

## 2023-01-19 DIAGNOSIS — Z01818 Encounter for other preprocedural examination: Secondary | ICD-10-CM

## 2023-01-19 DIAGNOSIS — Z1211 Encounter for screening for malignant neoplasm of colon: Secondary | ICD-10-CM | POA: Diagnosis not present

## 2023-01-19 DIAGNOSIS — Z8719 Personal history of other diseases of the digestive system: Secondary | ICD-10-CM | POA: Diagnosis not present

## 2023-01-19 DIAGNOSIS — K219 Gastro-esophageal reflux disease without esophagitis: Secondary | ICD-10-CM | POA: Diagnosis not present

## 2023-01-19 DIAGNOSIS — K644 Residual hemorrhoidal skin tags: Secondary | ICD-10-CM | POA: Diagnosis not present

## 2023-01-19 DIAGNOSIS — K921 Melena: Secondary | ICD-10-CM | POA: Diagnosis not present

## 2023-01-19 LAB — POCT PREGNANCY, URINE: Preg Test, Ur: NEGATIVE

## 2023-01-20 ENCOUNTER — Other Ambulatory Visit (HOSPITAL_COMMUNITY): Payer: BC Managed Care – PPO

## 2023-01-22 ENCOUNTER — Ambulatory Visit (HOSPITAL_COMMUNITY)
Admission: RE | Admit: 2023-01-22 | Discharge: 2023-01-22 | Disposition: A | Discharge status: Home / Self Care | Payer: BC Managed Care – PPO | Attending: Gastroenterology | Admitting: Gastroenterology

## 2023-01-22 ENCOUNTER — Encounter (HOSPITAL_COMMUNITY): Payer: Self-pay

## 2023-01-22 ENCOUNTER — Ambulatory Visit (HOSPITAL_COMMUNITY): Payer: BC Managed Care – PPO | Admitting: Anesthesiology

## 2023-01-22 ENCOUNTER — Encounter (HOSPITAL_COMMUNITY)
Admission: RE | Disposition: A | Discharge status: Home / Self Care | Payer: Self-pay | Source: Home / Self Care | Attending: Gastroenterology

## 2023-01-22 DIAGNOSIS — Z1211 Encounter for screening for malignant neoplasm of colon: Secondary | ICD-10-CM | POA: Insufficient documentation

## 2023-01-22 DIAGNOSIS — K648 Other hemorrhoids: Secondary | ICD-10-CM | POA: Diagnosis not present

## 2023-01-22 DIAGNOSIS — K625 Hemorrhage of anus and rectum: Secondary | ICD-10-CM

## 2023-01-22 DIAGNOSIS — Z87891 Personal history of nicotine dependence: Secondary | ICD-10-CM | POA: Diagnosis not present

## 2023-01-22 DIAGNOSIS — Z8719 Personal history of other diseases of the digestive system: Secondary | ICD-10-CM | POA: Diagnosis not present

## 2023-01-22 DIAGNOSIS — Z6841 Body Mass Index (BMI) 40.0 and over, adult: Secondary | ICD-10-CM | POA: Insufficient documentation

## 2023-01-22 DIAGNOSIS — K644 Residual hemorrhoidal skin tags: Secondary | ICD-10-CM | POA: Diagnosis not present

## 2023-01-22 DIAGNOSIS — K921 Melena: Secondary | ICD-10-CM | POA: Diagnosis not present

## 2023-01-22 DIAGNOSIS — R600 Localized edema: Secondary | ICD-10-CM | POA: Diagnosis not present

## 2023-01-22 DIAGNOSIS — K219 Gastro-esophageal reflux disease without esophagitis: Secondary | ICD-10-CM | POA: Diagnosis not present

## 2023-01-22 DIAGNOSIS — Z79899 Other long term (current) drug therapy: Secondary | ICD-10-CM | POA: Insufficient documentation

## 2023-01-22 DIAGNOSIS — K6389 Other specified diseases of intestine: Secondary | ICD-10-CM | POA: Insufficient documentation

## 2023-01-22 HISTORY — PX: BIOPSY: SHX5522

## 2023-01-22 HISTORY — PX: HEMOSTASIS CLIP PLACEMENT: SHX6857

## 2023-01-22 HISTORY — PX: COLONOSCOPY WITH PROPOFOL: SHX5780

## 2023-01-22 LAB — HM COLONOSCOPY

## 2023-01-22 SURGERY — COLONOSCOPY WITH PROPOFOL
Anesthesia: General

## 2023-01-22 MED ORDER — PROPOFOL 500 MG/50ML IV EMUL
INTRAVENOUS | Status: DC | PRN
  Administered 2023-01-22: 150 ug/kg/min via INTRAVENOUS

## 2023-01-22 MED ORDER — LACTATED RINGERS IV SOLN: INTRAVENOUS | Status: DC

## 2023-01-22 MED ORDER — EPHEDRINE 5 MG/ML INJ
INTRAVENOUS | Status: AC
  Filled 2023-01-22: qty 5

## 2023-01-22 MED ORDER — PROPOFOL 500 MG/50ML IV EMUL
INTRAVENOUS | Status: AC
  Filled 2023-01-22: qty 150

## 2023-01-22 MED ORDER — STERILE WATER FOR IRRIGATION IR SOLN
Status: DC | PRN
  Administered 2023-01-22: 100 mL

## 2023-01-22 MED ORDER — EPHEDRINE SULFATE (PRESSORS) 50 MG/ML IJ SOLN
INTRAMUSCULAR | Status: DC | PRN
  Administered 2023-01-22 (×2): 5 mg via INTRAVENOUS

## 2023-01-22 MED ORDER — PROPOFOL 10 MG/ML IV BOLUS
INTRAVENOUS | Status: DC | PRN
Start: 2023-01-22 — End: 2023-01-22
  Administered 2023-01-22: 100 mg via INTRAVENOUS
  Administered 2023-01-22: 50 mg via INTRAVENOUS
  Administered 2023-01-22: 100 mg via INTRAVENOUS

## 2023-01-22 NOTE — Anesthesia Preprocedure Evaluation (Addendum)
Anesthesia Evaluation  Patient identified by MRN, date of birth, ID band Patient awake    Reviewed: Allergy & Precautions, H&P , NPO status , Patient's Chart, lab work & pertinent test results  Airway Mallampati: II  TM Distance: >3 FB Neck ROM: Full    Dental  (+) Dental Advisory Given, Teeth Intact   Pulmonary former smoker   Pulmonary exam normal breath sounds clear to auscultation       Cardiovascular negative cardio ROS Normal cardiovascular exam Rhythm:Regular Rate:Normal     Neuro/Psych  Headaches PSYCHIATRIC DISORDERS Anxiety        GI/Hepatic Neg liver ROS,GERD  Medicated,,  Endo/Other    Morbid obesity  Renal/GU negative Renal ROS  negative genitourinary   Musculoskeletal negative musculoskeletal ROS (+)    Abdominal   Peds negative pediatric ROS (+)  Hematology negative hematology ROS (+)   Anesthesia Other Findings Bilateral leg edema - On furosemide   Reproductive/Obstetrics negative OB ROS                             Anesthesia Physical Anesthesia Plan  ASA: 2  Anesthesia Plan: General   Post-op Pain Management: Minimal or no pain anticipated   Induction: Intravenous  PONV Risk Score and Plan: 1 and Propofol infusion  Airway Management Planned: Nasal Cannula and Natural Airway  Additional Equipment:   Intra-op Plan:   Post-operative Plan:   Informed Consent: I have reviewed the patients History and Physical, chart, labs and discussed the procedure including the risks, benefits and alternatives for the proposed anesthesia with the patient or authorized representative who has indicated his/her understanding and acceptance.     Dental advisory given  Plan Discussed with: CRNA and Surgeon  Anesthesia Plan Comments:         Anesthesia Quick Evaluation

## 2023-01-22 NOTE — Discharge Instructions (Signed)

## 2023-01-22 NOTE — Anesthesia Postprocedure Evaluation (Signed)
Anesthesia Post Note  Patient: Bethany Wilson  Procedure(s) Performed: COLONOSCOPY WITH PROPOFOL BIOPSY HEMOSTASIS CLIP PLACEMENT  Patient location during evaluation: Phase II Anesthesia Type: General Level of consciousness: awake and alert and oriented Pain management: pain level controlled Vital Signs Assessment: post-procedure vital signs reviewed and stable Respiratory status: spontaneous breathing, nonlabored ventilation and respiratory function stable Cardiovascular status: blood pressure returned to baseline and stable Postop Assessment: no apparent nausea or vomiting Anesthetic complications: no  No notable events documented.   Last Vitals:  Vitals:   01/22/23 1022 01/22/23 1025  BP: (!) 85/64 (!) 109/54  Pulse:    Resp:    Temp:    SpO2:      Last Pain:  Vitals:   01/22/23 1016  TempSrc: Oral  PainSc: 0-No pain                 [name] C Isaias Dowson

## 2023-01-22 NOTE — Op Note (Signed)
[facility] Patient Name: Bethany Wilson Procedure Date: 01/22/2023 9:15 AM MRN: [id number] Date of Birth: 1975/08/25 Attending MD: Sanjuan Dame , MD, [id number] CSN: [id number] Age: 47 Admit Type: Outpatient Procedure:                Colonoscopy Indications:              Hematochezia Providers:                Sanjuan Dame, MD, Buel Ream. Thomasena Edis RN, RN,                            Lennice Sites Technician, Pensions consultant Referring MD:              Medicines:                Monitored Anesthesia Care Complications:            No immediate complications. Estimated Blood Loss:     Estimated blood loss was minimal. Procedure:                Pre-Anesthesia Assessment:                           - Prior to the procedure, a History and Physical                            was performed, and patient medications and                            allergies were reviewed. The patient's tolerance of                            previous anesthesia was also reviewed. The risks                            and benefits of the procedure and the sedation                            options and risks were discussed with the patient.                            All questions were answered, and informed consent                            was obtained. Prior Anticoagulants: The patient has                            taken no anticoagulant or antiplatelet agents                            except for NSAID medication. ASA Grade Assessment:                            III - A patient with severe systemic disease. After  reviewing the risks and benefits, the patient was                            deemed in satisfactory condition to undergo the                            procedure.                           After obtaining informed consent, the colonoscope                            was passed under direct vision. Throughout the                            procedure, the patient's blood  pressure, pulse, and                            oxygen saturations were monitored continuously. The                            [phone]) scope was introduced through                            the anus and advanced to the the terminal ileum.                            The colonoscopy was somewhat difficult due to a                            tortuous colon. The patient tolerated the procedure                            well. The quality of the bowel preparation was                            evaluated using the BBPS [facility] Bowel Preparation                            Scale) with scores of: Right Colon = 2 (minor                            amount of residual staining, small fragments of                            stool and/or opaque liquid, but mucosa seen well),                            Transverse Colon = 2 (minor amount of residual                            staining, small fragments of stool and/or opaque  liquid, but mucosa seen well) and Left Colon = 2                            (minor amount of residual staining, small fragments                            of stool and/or opaque liquid, but mucosa seen                            well). The total BBPS score equals 6. The terminal                            ileum, ileocecal valve, appendiceal orifice, and                            rectum were photographed. Scope In: 9:30:34 AM Scope Out: 10:12:28 AM Scope Withdrawal Time: 0 hours 23 minutes 37 seconds  Total Procedure Duration: 0 hours 41 minutes 54 seconds  Findings:      The perianal and digital rectal examinations were normal.      A diffuse area of melanosis was found in the ascending colon and in the       cecum. Biopsies were taken with a cold forceps for histology. For       hemostasis, one hemostatic clip was successfully placed (MR       conditional). Clip manufacturer: [employer]. There was no       bleeding at the end of the  procedure.      External and internal hemorrhoids were found during retroflexion. The       hemorrhoids were moderate.      The terminal ileum appeared normal. Impression:               - Melanosis in the colon. Biopsied. Clip (MR                            conditional) was placed. Clip manufacturer: [employer].                           - External and internal hemorrhoids likely source                            of hematochezia.                           Normal Terminal ileum Moderate Sedation:      Per Anesthesia Care Recommendation:           - Repeat colonoscopy in 5 years for screening                            purposes.                           - Return to primary care physician as previously  scheduled. Procedure Code(s):        --- Professional ---                           [phone], Colonoscopy, flexible; with control of                            bleeding, any method Diagnosis Code(s):        --- Professional ---                           K63.89, Other specified diseases of intestine                           K64.8, Other hemorrhoids                           K92.1, Melena (includes Hematochezia) CPT copyright 2022 American Medical Association. All rights reserved. The codes documented in this report are preliminary and upon coder review may  be revised to meet current compliance requirements. Sanjuan Dame, MD Sanjuan Dame, MD 01/22/2023 10:22:07 AM This report has been signed electronically. Number of Addenda: 0

## 2023-01-22 NOTE — Transfer of Care (Signed)
Immediate Anesthesia Transfer of Care Note  Patient: Bethany Wilson  Procedure(s) Performed: COLONOSCOPY WITH PROPOFOL BIOPSY HEMOSTASIS CLIP PLACEMENT  Patient Location: Short Stay  Anesthesia Type:General  Level of Consciousness: awake  Airway & Oxygen Therapy: Patient Spontanous Breathing  Post-op Assessment: Report given to RN and Post -op Vital signs reviewed and stable  Post vital signs: Reviewed and stable  Last Vitals:  Vitals Value Taken Time  BP 78/47 01/22/23 1016  Temp 36.6 C 01/22/23 1016  Pulse 77 01/22/23 1016  Resp 21 01/22/23 1016  SpO2 100 % 01/22/23 1016    Last Pain:  Vitals:   01/22/23 1016  TempSrc: Oral  PainSc: 0-No pain      Patients Stated Pain Goal: 5 (01/22/23 0844)  Complications: No notable events documented.

## 2023-01-22 NOTE — Interval H&P Note (Signed)
History and Physical Interval Note:  01/22/2023 9:14 AM  Bethany Wilson  has presented today for surgery, with the diagnosis of rectal bleeding.  The various methods of treatment have been discussed with the patient and family. After consideration of risks, benefits and other options for treatment, the patient has consented to  Procedure(s) with comments: COLONOSCOPY WITH PROPOFOL (N/A) - 2:00 pm, asa 3 as a surgical intervention.  The patient's history has been reviewed, patient examined, no change in status, stable for surgery.  I have reviewed the patient's chart and labs.  Questions were answered to the patient's satisfaction.     [name]

## 2023-01-26 ENCOUNTER — Encounter (INDEPENDENT_AMBULATORY_CARE_PROVIDER_SITE_OTHER): Payer: Self-pay | Admitting: *Deleted

## 2023-01-27 ENCOUNTER — Encounter (INDEPENDENT_AMBULATORY_CARE_PROVIDER_SITE_OTHER): Payer: Self-pay | Admitting: *Deleted

## 2023-01-28 ENCOUNTER — Encounter (HOSPITAL_COMMUNITY): Payer: Self-pay | Admitting: Gastroenterology

## 2023-02-13 DIAGNOSIS — Z139 Encounter for screening, unspecified: Secondary | ICD-10-CM | POA: Diagnosis not present

## 2023-02-13 DIAGNOSIS — Z136 Encounter for screening for cardiovascular disorders: Secondary | ICD-10-CM | POA: Diagnosis not present

## 2023-02-18 DIAGNOSIS — T148XXA Other injury of unspecified body region, initial encounter: Secondary | ICD-10-CM | POA: Diagnosis not present

## 2023-02-18 DIAGNOSIS — R600 Localized edema: Secondary | ICD-10-CM | POA: Diagnosis not present

## 2023-02-18 DIAGNOSIS — Z0000 Encounter for general adult medical examination without abnormal findings: Secondary | ICD-10-CM | POA: Diagnosis not present

## 2023-02-18 DIAGNOSIS — R58 Hemorrhage, not elsewhere classified: Secondary | ICD-10-CM | POA: Diagnosis not present

## 2023-02-18 DIAGNOSIS — E669 Obesity, unspecified: Secondary | ICD-10-CM | POA: Diagnosis not present

## 2023-11-16 DIAGNOSIS — J209 Acute bronchitis, unspecified: Secondary | ICD-10-CM | POA: Diagnosis not present

## 2023-11-16 DIAGNOSIS — R059 Cough, unspecified: Secondary | ICD-10-CM | POA: Diagnosis not present

## 2023-11-23 DIAGNOSIS — Z6833 Body mass index (BMI) 33.0-33.9, adult: Secondary | ICD-10-CM | POA: Diagnosis not present

## 2023-11-23 DIAGNOSIS — E669 Obesity, unspecified: Secondary | ICD-10-CM | POA: Diagnosis not present

## 2023-11-23 DIAGNOSIS — J019 Acute sinusitis, unspecified: Secondary | ICD-10-CM | POA: Diagnosis not present

## 2023-12-16 DIAGNOSIS — J0140 Acute pansinusitis, unspecified: Secondary | ICD-10-CM | POA: Diagnosis not present

## 2024-02-08 DIAGNOSIS — J320 Chronic maxillary sinusitis: Secondary | ICD-10-CM | POA: Diagnosis not present

## 2024-02-08 DIAGNOSIS — Z1331 Encounter for screening for depression: Secondary | ICD-10-CM | POA: Diagnosis not present

## 2024-02-08 DIAGNOSIS — Z136 Encounter for screening for cardiovascular disorders: Secondary | ICD-10-CM | POA: Diagnosis not present

## 2024-02-08 DIAGNOSIS — Z13220 Encounter for screening for lipoid disorders: Secondary | ICD-10-CM | POA: Diagnosis not present

## 2024-02-08 DIAGNOSIS — E559 Vitamin D deficiency, unspecified: Secondary | ICD-10-CM | POA: Diagnosis not present

## 2024-02-08 DIAGNOSIS — Z0000 Encounter for general adult medical examination without abnormal findings: Secondary | ICD-10-CM | POA: Diagnosis not present

## 2024-02-25 DIAGNOSIS — I10 Essential (primary) hypertension: Secondary | ICD-10-CM | POA: Diagnosis not present

## 2024-02-29 DIAGNOSIS — Z01419 Encounter for gynecological examination (general) (routine) without abnormal findings: Secondary | ICD-10-CM | POA: Diagnosis not present

## 2024-02-29 DIAGNOSIS — Z1231 Encounter for screening mammogram for malignant neoplasm of breast: Secondary | ICD-10-CM | POA: Diagnosis not present

## 2024-02-29 DIAGNOSIS — Z6831 Body mass index (BMI) 31.0-31.9, adult: Secondary | ICD-10-CM | POA: Diagnosis not present

## 2024-05-05 DIAGNOSIS — M791 Myalgia, unspecified site: Secondary | ICD-10-CM | POA: Diagnosis not present

## 2024-05-05 DIAGNOSIS — M26622 Arthralgia of left temporomandibular joint: Secondary | ICD-10-CM | POA: Diagnosis not present
